# Patient Record
Sex: Female | Born: 1935 | Race: White | Hispanic: No | Smoking: Never smoker
Health system: Southern US, Community
[De-identification: ages and names within clinical notes are randomized; demographics above are authoritative.]

## PROBLEM LIST (undated history)

## (undated) DIAGNOSIS — I2510 Atherosclerotic heart disease of native coronary artery without angina pectoris: Secondary | ICD-10-CM

## (undated) DIAGNOSIS — I10 Essential (primary) hypertension: Secondary | ICD-10-CM

## (undated) DIAGNOSIS — I4891 Unspecified atrial fibrillation: Secondary | ICD-10-CM

## (undated) HISTORY — PX: TONSILLECTOMY: SUR1361

## (undated) HISTORY — PX: ABDOMINAL HYSTERECTOMY: SHX81

## (undated) HISTORY — PX: ROTATOR CUFF REPAIR: SHX139

## (undated) HISTORY — PX: CATARACT EXTRACTION, BILATERAL: SHX1313

## (undated) HISTORY — PX: CHOLECYSTECTOMY: SHX55

## (undated) HISTORY — PX: OOPHORECTOMY: SHX86

---

## 1998-02-25 ENCOUNTER — Other Ambulatory Visit: Admission: RE | Admit: 1998-02-25 | Discharge: 1998-02-25 | Payer: Self-pay | Admitting: Obstetrics and Gynecology

## 1999-06-22 ENCOUNTER — Ambulatory Visit (HOSPITAL_COMMUNITY): Admission: RE | Admit: 1999-06-22 | Discharge: 1999-06-22 | Payer: Self-pay | Admitting: *Deleted

## 1999-06-22 ENCOUNTER — Encounter: Payer: Self-pay | Admitting: *Deleted

## 2000-05-04 ENCOUNTER — Encounter: Payer: Self-pay | Admitting: *Deleted

## 2000-05-04 ENCOUNTER — Encounter: Admission: RE | Admit: 2000-05-04 | Discharge: 2000-05-04 | Payer: Self-pay | Admitting: *Deleted

## 2000-10-05 ENCOUNTER — Encounter: Admission: RE | Admit: 2000-10-05 | Discharge: 2000-10-05 | Payer: Self-pay | Admitting: Internal Medicine

## 2000-10-05 ENCOUNTER — Encounter: Payer: Self-pay | Admitting: Internal Medicine

## 2001-05-29 ENCOUNTER — Encounter: Payer: Self-pay | Admitting: Internal Medicine

## 2001-05-29 ENCOUNTER — Encounter: Admission: RE | Admit: 2001-05-29 | Discharge: 2001-05-29 | Payer: Self-pay | Admitting: Internal Medicine

## 2002-06-03 ENCOUNTER — Encounter: Payer: Self-pay | Admitting: Internal Medicine

## 2002-06-03 ENCOUNTER — Encounter: Admission: RE | Admit: 2002-06-03 | Discharge: 2002-06-03 | Payer: Self-pay | Admitting: Internal Medicine

## 2003-06-05 ENCOUNTER — Encounter: Payer: Self-pay | Admitting: Internal Medicine

## 2003-06-05 ENCOUNTER — Encounter: Admission: RE | Admit: 2003-06-05 | Discharge: 2003-06-05 | Payer: Self-pay | Admitting: Internal Medicine

## 2003-12-22 ENCOUNTER — Emergency Department (HOSPITAL_COMMUNITY): Admission: EM | Admit: 2003-12-22 | Discharge: 2003-12-23 | Payer: Self-pay | Admitting: Emergency Medicine

## 2004-01-17 ENCOUNTER — Ambulatory Visit (HOSPITAL_COMMUNITY): Admission: RE | Admit: 2004-01-17 | Discharge: 2004-01-17 | Payer: Self-pay | Admitting: Orthopedic Surgery

## 2004-02-12 ENCOUNTER — Encounter: Admission: RE | Admit: 2004-02-12 | Discharge: 2004-02-12 | Payer: Self-pay | Admitting: Orthopedic Surgery

## 2004-02-15 ENCOUNTER — Ambulatory Visit (HOSPITAL_COMMUNITY): Admission: RE | Admit: 2004-02-15 | Discharge: 2004-02-15 | Payer: Self-pay | Admitting: Orthopedic Surgery

## 2004-02-15 ENCOUNTER — Ambulatory Visit (HOSPITAL_BASED_OUTPATIENT_CLINIC_OR_DEPARTMENT_OTHER): Admission: RE | Admit: 2004-02-15 | Discharge: 2004-02-15 | Payer: Self-pay | Admitting: Orthopedic Surgery

## 2004-02-29 ENCOUNTER — Encounter: Admission: RE | Admit: 2004-02-29 | Discharge: 2004-05-29 | Payer: Self-pay | Admitting: Orthopedic Surgery

## 2004-04-18 ENCOUNTER — Encounter: Admission: RE | Admit: 2004-04-18 | Discharge: 2004-07-17 | Payer: Self-pay | Admitting: Orthopedic Surgery

## 2004-06-10 ENCOUNTER — Ambulatory Visit (HOSPITAL_COMMUNITY): Admission: RE | Admit: 2004-06-10 | Discharge: 2004-06-10 | Payer: Self-pay | Admitting: Internal Medicine

## 2005-06-13 ENCOUNTER — Ambulatory Visit (HOSPITAL_COMMUNITY): Admission: RE | Admit: 2005-06-13 | Discharge: 2005-06-13 | Payer: Self-pay | Admitting: Internal Medicine

## 2005-08-28 ENCOUNTER — Encounter: Admission: RE | Admit: 2005-08-28 | Discharge: 2005-08-28 | Payer: Self-pay | Admitting: Surgery

## 2005-08-31 ENCOUNTER — Encounter (INDEPENDENT_AMBULATORY_CARE_PROVIDER_SITE_OTHER): Payer: Self-pay | Admitting: Specialist

## 2005-08-31 ENCOUNTER — Ambulatory Visit (HOSPITAL_BASED_OUTPATIENT_CLINIC_OR_DEPARTMENT_OTHER): Admission: RE | Admit: 2005-08-31 | Discharge: 2005-08-31 | Payer: Self-pay | Admitting: Surgery

## 2005-08-31 ENCOUNTER — Ambulatory Visit (HOSPITAL_COMMUNITY): Admission: RE | Admit: 2005-08-31 | Discharge: 2005-08-31 | Payer: Self-pay | Admitting: Surgery

## 2006-01-10 ENCOUNTER — Inpatient Hospital Stay (HOSPITAL_COMMUNITY): Admission: EM | Admit: 2006-01-10 | Discharge: 2006-01-12 | Payer: Self-pay | Admitting: Emergency Medicine

## 2006-01-11 ENCOUNTER — Encounter (INDEPENDENT_AMBULATORY_CARE_PROVIDER_SITE_OTHER): Payer: Self-pay | Admitting: *Deleted

## 2006-06-27 ENCOUNTER — Ambulatory Visit (HOSPITAL_COMMUNITY): Admission: RE | Admit: 2006-06-27 | Discharge: 2006-06-27 | Payer: Self-pay | Admitting: Internal Medicine

## 2006-10-09 ENCOUNTER — Emergency Department (HOSPITAL_COMMUNITY): Admission: EM | Admit: 2006-10-09 | Discharge: 2006-10-10 | Payer: Self-pay | Admitting: Emergency Medicine

## 2007-07-02 ENCOUNTER — Ambulatory Visit (HOSPITAL_COMMUNITY): Admission: RE | Admit: 2007-07-02 | Discharge: 2007-07-02 | Payer: Self-pay | Admitting: Internal Medicine

## 2008-04-14 ENCOUNTER — Encounter: Admission: RE | Admit: 2008-04-14 | Discharge: 2008-04-14 | Payer: Self-pay | Admitting: Internal Medicine

## 2008-07-14 ENCOUNTER — Ambulatory Visit (HOSPITAL_COMMUNITY): Admission: RE | Admit: 2008-07-14 | Discharge: 2008-07-14 | Payer: Self-pay | Admitting: Internal Medicine

## 2008-08-26 ENCOUNTER — Ambulatory Visit (HOSPITAL_COMMUNITY): Admission: RE | Admit: 2008-08-26 | Discharge: 2008-08-26 | Payer: Self-pay | Admitting: Cardiology

## 2009-05-22 ENCOUNTER — Emergency Department (HOSPITAL_COMMUNITY): Admission: EM | Admit: 2009-05-22 | Discharge: 2009-05-22 | Payer: Self-pay | Admitting: Emergency Medicine

## 2010-03-30 ENCOUNTER — Emergency Department (HOSPITAL_BASED_OUTPATIENT_CLINIC_OR_DEPARTMENT_OTHER): Admission: EM | Admit: 2010-03-30 | Discharge: 2010-03-30 | Payer: Self-pay | Admitting: Emergency Medicine

## 2010-03-30 ENCOUNTER — Ambulatory Visit: Payer: Self-pay | Admitting: Diagnostic Radiology

## 2011-03-24 NOTE — Discharge Summary (Signed)
Maria [name], Maria [name]              ACCOUNT NO.:  [id number]   MEDICAL RECORD NO.:  [id number]          PATIENT TYPE:  INP   LOCATION:  2014                         FACILITY:  [facility]   PHYSICIAN:  Lyn Records, M.D.   DATE OF BIRTH:  1924/12/24   DATE OF ADMISSION:  01/10/2006  DATE OF DISCHARGE:  01/12/2006                                 DISCHARGE SUMMARY   ADMISSION DIAGNOSIS:  1.  Syncope secondary to atrial fibrillation with rapid ventricular response      with spontaneous conversion to normal sinus rhythm.   DISCHARGE DIAGNOSIS:  1.  Syncope secondary to atrial fibrillation with rapid ventricular response      with spontaneous conversion to normal sinus rhythm.  No recurrence of      syncope.  Currently maintaining normal sinus rhythm without recurrence      of atrial fibrillation.  2.  Atrial fibrillation with rapid ventricular response with spontaneous      conversion to normal sinus rhythm.  3.  Hypokalemia, repleted.  4.  Leukocytosis possibly secondary to chronic steroid use.  5.  Temporal arteritis.  Treated with chronic steroid use.  6.  Hypertension, controlled.  7.  Hyperthyroidism status post radiation treatment, no hypothyroidism.  8.  Herpes zoster secondary to Fosamax use.  9.  Glaucoma.  10. Gastroesophageal reflux disease.   PROCEDURES:  1.  2-D echocardiogram on January 11, 2006 revealed the following:      1.  Image quality was adequate.  Suboptimal parasternal windows.      2.  Left ventricular size was at the upper limits of normal.  Overall          left ventricular systolic function was normal.  Left ventricular          ejection fraction was estimated, range being 55-60%.  There was no          diagnostic evidence of left ventricular regional wall motion          abnormalities.  There was mild focal basal septal hypertrophy.      3.  Aortic valve thickness was mildly increased.  There was mild aortic          valvular regurgitation.      4.  There was  mild mitral valvular regurgitation.  2.  CT of the head without contrast.      1.  No acute intracranial abnormalities.      2.  Age-related atrophy and mild changes of small vessel disease of the          white matter diffusely.   HOSPITAL COURSE:  Maria [name] is a 77 year old female with a history of  hypertension, hypothyroidism, glaucoma, temporal arteritis, and at the time  of admission, a current diagnosis of herpes zoster.  She presented to the  [facility] Emergency Department after feeling weak and fatigued on the  morning of January 10, 2006.  She became diaphoretic and experienced a syncopal  episode after standing.  She did not recall any symptoms prior to the  syncopal episode and denies any loss of bowel  or bladder function during the  syncopal episode.  She regained consciousness at home.  She was brought to  the [facility] Emergency Department via EMS where an EKG revealed sinus  tachycardia with an arrhythmia.  Upon arrival she was currently in normal  sinus rhythm.  She denied having any of her cardiac medications today.  She  also denied taking her potassium for the past three months because the pills  were difficult to swallow.  The point of care enzyme x1 was negative.  Serial enzymes were obtained with a peak troponin I of 0.51.  The patient  was discovered to be hypokalemic upon this admission and her potassium was  repleted.  Her triamterene/HCTZ was held in the process of completing her  potassium.  She also complained of dizziness so orthostatic blood pressures  were obtained not only in the ED, but also daily.  She was not found to be  orthostatic.  She was started on Cardizem 60 mg every six hours with blood  pressure and heart rate parameters and the following day the Cardizem was  discontinued and she was started on Diltiazem ER 180 mg daily.  TSH,  magnesium, and UA with C&S were all within normal limits.  During the  syncopal episode the patient hit her head  and had a visible nodule.  She  denied headaches; however, a CT without contrast was ordered of her head and  yielded negative results.  A 2-D echocardiogram revealed normal systolic  function with EF in the range of 55-60%.  There were no wall motion  abnormalities; however, there was some mild aortic and mitral valve  regurgitation.  Also on 2-D echocardiogram mild focal basal septal  hypertrophy was seen.  Chest x-ray upon admission was normal and the patient  remained afebrile.  Maria [name] is on chronic steroid use for her temporal  arteritis which is believed to be the cause of her leukocytosis which showed  improvement during the admission.  She was started on a low-dose beta  blocker during this admission as well as scheduled for an adenosine  Cardiolite as an outpatient.  The patient denied any further palpitations,  dizziness, weakness, fatigue.  She also denied chest pain.  On telemetry she  remained in normal sinus rhythm without any recurrence of atrial  fibrillation.  Subsequent EKGs revealed normal sinus rhythm with nonspecific  ST and T-wave abnormality.  There were no recurrences of syncopal episodes.  Prior to discharge hypokalemia and leukocytosis were resolved.  The  Diltiazem ER 180 mg was discontinued.  The patient was discharged to home in  stable condition.   LABORATORY DATA:  White blood count 7.1, hemoglobin 12.1, hematocrit 34.5,  platelets 166.  Sodium 141, potassium 4.2, chloride 113, CO2 25, glucose 87,  BUN 16, creatinine 1.1.  Serial cardiac enzymes CK total 40, 73, and 77,  respectively.  CK-MB 2.8, 4.6, and 3.7, respectively.  Troponin I 0.16,  0.51, and 0.44, respectively.  BNP 1.36.  TSH 1.806.  PT 12.9, INR 1, PTT  25.   Chest x-ray single view January 10, 2006:  Scarring at the left lung base - no  active disease.   EKG January 11, 2006:  Normal sinus rhythm with a ventricular rate of 94 bpm  with nonspecific ST and T-wave abnormalities.  CONDITION ON  DISCHARGE:  On January 12, 2006 the patient was discharged to home  in stable condition without any complaints of palpitations, weakness,  fatigue, dizziness, syncope, headaches,  chest pain, or other complaints.   DISCHARGE MEDICATIONS:  1.  Synthroid 100 mcg daily.  2.  Prednisone 17 mg daily.  3.  Nexium 40 mg daily with the next dose to begin on January 13, 2006.  4.  Triamterene/HCTZ 37.5/25 mg daily with the next dose to restart after      your next BMET.  5.  Calcium plus vitamin D 600 mg twice daily.  6.  Multivitamin daily.  7.  Potassium elixir 20 mEq per teaspoon one teaspoon daily.  This is a new      prescription with a prescription given with refills.  8.  Lopressor 25 mg twice daily.  9.  Coated aspirin 81 mg daily.   DISCHARGE INSTRUCTIONS:  1.  The patient has been instructed to follow a heart-healthy, low salt      diet.  The patient has also been instructed to call Dr. Katrinka Blazing with      recurrent episodes of palpitations, dizziness, weakness, or fatigue.  2.  The patient has also been instructed to adhere to the following      instructions before her scheduled outpatient adenosine Cardiolite.      1.  Nothing to eat or drink after midnight the night before the          adenosine Cardiolite except medications.      2.  No caffeine 24 hours prior to test.      3.  No smoking the day of test.   FOLLOW UP ARRANGEMENTS:  1.  The patient has been scheduled for an adenosine Cardiolite stress test      on an outpatient basis at [facility] on January 16, 2006 at 12:00      p.m.  She is to call [phone] if she is unable to keep the scheduled      appointment.  2.  The patient has been scheduled for follow-up appointment with Dr. Katrinka Blazing      at the [facility] office on January 25, 2006 2:30 p.m.  The patient      is scheduled to call [phone] if she is unable to keep the scheduled      appointment.      Tylene Fantasia, [place]      Lyn Records, M.D.   Electronically Signed    RDM/MEDQ  D:  03/12/2006  T:  03/13/2006  Job:  [id number]   cc:   Thora Lance, M.D.  Fax: [phone]

## 2011-03-24 NOTE — Op Note (Signed)
NAME:  Maria [name], Maria [name]                        ACCOUNT NO.:  [id number]   MEDICAL RECORD NO.:  [id number]                   PATIENT TYPE:  AMB   LOCATION:  DSC                                  FACILITY:  [facility]   PHYSICIAN:  Robert A. Thurston Hole, M.D.              DATE OF BIRTH:  05/11/1925   DATE OF PROCEDURE:  02/15/2004  DATE OF DISCHARGE:                                 OPERATIVE REPORT   PREOPERATIVE DIAGNOSES:  1. Left shoulder rotator cuff tear.  2. Left shoulder partial glenoid labrum tear.  3. Left shoulder adhesive capsulitis.  4. Left shoulder impingement.   POSTOPERATIVE DIAGNOSES:  1. Left shoulder rotator cuff tear.  2. Left shoulder partial glenoid labrum tear.  3. Left shoulder adhesive capsulitis.  4. Left shoulder impingement.   PROCEDURES:  1. Left shoulder examination under anesthesia, followed by manipulation.  2. Left shoulder arthroscopically-assisted rotator cuff repair using Arthrex     suture anchor x1.  3. Left shoulder subacromial decompression.  4. Left shoulder lysis of adhesions.   SURGEON:  Elana Alm. Thurston Hole, M.D.   ASSISTANT:  Julien Girt, P.A.   ANESTHESIA:  General.   OPERATIVE TIME:  One hour.   COMPLICATIONS:  None.   INDICATION FOR PROCEDURE:  Maria [name] is a 77 year old woman who injured her  left shoulder three months ago in a fall.  Significant pain with exam and  MRI documenting a rotator cuff tear and now secondary adhesive capsulitis  with impingement, who has failed conservative care and is now to undergo  arthroscopy and repair.   DESCRIPTION:  Maria [name] was brought to the operating room on February 15, 2004, after an interscalene block had been placed in the holding room by  anesthesia.  She was placed on the operating table in supine position.  After being placed under general anesthesia, she was placed in a beach chair  position.  Her shoulder was examined.  Initial range of motion showed  forward flexion to 90,  abduction to 90, internal and external rotation of 50  degrees.  A gentle manipulation was carried out, breaking up adhesions and  improving forward flexion to 175, abduction to 175, internal and external  rotation of 85 degrees.  The shoulder remained stable to ligamentous exam.  She received Ancef 1 g IV preoperatively for prophylaxis.  Her left shoulder  and arm were then prepped using sterile Duraprep and draped using sterile  technique.  Originally through a posterior arthroscopic portal, the  arthroscope with a pump attached was placed into an anterior portal and  arthroscopic probe was placed.  On initial inspection the articular  cartilage in the glenohumeral joint was intact.  There was significant  hemorrhagic synovitis secondary to her manipulation and adhesive capsulitis,  and this was debrided.  She had tearing of the anterior labrum 25-30%, which  was debrided.  Superior labrum, biceps tendon anchor was intact.  The  biceps  tendon was intact.  The inferior labrum and anterior inferior glenohumeral  ligament complex was intact.  Posterior labrum showed moderate fraying, and  this was debrided.  The rotator cuff showed a complete tear of the  supraspinatus, which was partially debrided.  The rest of the rotator cuff  was intact.  The inferior capsular recess was free of pathology.  Subacromial space was entered and a lateral arthroscopic portal was made.  Moderately thickened bursitis was resected.  Impingement was noted and  subacromial decompression was carried out as well as a CA ligament release.  The [facility] joint was not disturbed.  The rotator cuff showed a tear clearly on  the bursal side, and this was further debrided arthroscopically and through  an accessory portal.  An Arthrex suture anchor was placed and then each of  the sutures were passed through the rotator cuff tear and tied down  arthroscopically.  After this was done, the shoulder could be brought  through a full  range of motion with no impingement on the repair.  At this  point it was felt that all pathology had been satisfactorily addressed.  The  instruments were removed.  Portals closed with 3-0 nylon suture, sterile  dressings and a sling applied, and the patient awakened and taken to the  recovery room in stable condition.   FOLLOW-UP CARE:  Maria [name] will be followed as an outpatient on Vicodin and  ibuprofen, with early physical therapy.  See her back in the office in a  week for sutures out and follow-up.                                               Robert A. Thurston Hole, M.D.    RAW/MEDQ  D:  02/15/2004  T:  02/15/2004  Job:  [id number]

## 2011-03-24 NOTE — Op Note (Signed)
[name], [name]              ACCOUNT NO.:  [id number]   MEDICAL RECORD NO.:  [id number]          PATIENT TYPE:  AMB   LOCATION:  DSC                          FACILITY:  [facility]   PHYSICIAN:  Thomas A. Cornett, M.D.DATE OF BIRTH:  04-29-1925   DATE OF PROCEDURE:  08/31/2005  DATE OF DISCHARGE:                                 OPERATIVE REPORT   PREOPERATIVE DIAGNOSIS:  Headaches, questionable temporal arteritis.   POSTOPERATIVE DIAGNOSIS:  Headaches, questionable temporal arteritis.   PROCEDURE:  Right temporal artery biopsy.   SURGEON:  Maisie Fus A. Cornett, M.D.   ASSISTANT:  Leonie Man, M.D.   ANESTHESIA:  MAC using approximately 30 mL of 0.25% Sensorcaine local with  epinephrine.   ESTIMATED BLOOD LOSS:  10 mL.   FINDINGS:  Severe inflammatory change to the temporal artery with a  significant amount of calcification.   INDICATIONS FOR PROCEDURE:  The patient is a 77 year old female who had been  having headaches.  She was referred for a temporal artery biopsy to exclude  temporal arteritis.  She had elevated CRP as well as other inflammatory  indicators.  After discussion of the procedure, she agreed to proceed.   DESCRIPTION OF PROCEDURE:  The patient was brought to the operating suite  and placed supine.  The right temple region was prepped and draped in a  sterile fashion.  She had a very weak pulsation over this area.  Next, a  skin incision was made over the right temporal region and dissection was  carried down to the subcutaneous fat.  The initial fascial layer was opened  and I used and Doppler to try to locate the vessel.  This was very difficult  since there did not appear to be a significant superficial component to the  temporal artery.  We dissected deeper down toward the temporalis muscle.  Again, we were having a very difficult time finding the vessel, since there  was not a very strong pulsation to it and the Doppler signal was quite  faint.  After  an hour and a half of searching for the vessel carefully, we  dissected down into the temporalis muscle itself.  Once we were able to get  down through this, we encountered the vessel deep in the temporalis muscle.  It was severely inflamed and appeared atherosclerotic as well.  We were able  to isolate a small segment of it in the muscle area itself and try to  dissect this out.  This again was extremely inflamed and difficult to find,  but at this point, it appeared arterial was significant calcification.  Of  note, this appeared severely inflamed.  We ligated a small segment of the  vessel using 3-0 Vicryl ties.  We then took the vessel within the muscle  itself and sent a piece of skeletal muscle with it as well.  Unfortunately,  the specimen was not big enough for a frozen section. but it did appear to  be the temporal artery, once we dissected out the vessel.  At  this point in time the wound was closed with a combination of  3-0 Vicryl and  4-0 Monocryl subcuticular stitches.  All sponge, needle and instrument  counts were counted and found to be correct at this portion of the case.  The patient was taken to recovery in satisfactory condition.      Thomas A. Cornett, M.D.  Electronically Signed     TAC/MEDQ  D:  08/31/2005  T:  09/01/2005  Job:  [id number]   cc:   Thora Lance, M.D.  Fax: [phone]   [facility]

## 2011-03-24 NOTE — H&P (Signed)
NAMEANDERSYN, FRAGOSO NO.:  [id number]   MEDICAL RECORD NO.:  [id number]          PATIENT TYPE:  INP   LOCATION:  1832                         FACILITY:  [facility]   PHYSICIAN:  Lyn Records, M.D.   DATE OF BIRTH:  03-Aug-1925   DATE OF ADMISSION:  01/10/2006  DATE OF DISCHARGE:                                HISTORY & PHYSICAL   PHYSICIANS:  Cardiologist: Lyn Records, M.D.  Primary care physician: Thora Lance, M.D.   HISTORY OF PRESENT ILLNESS:  Mrs. Dickman is a 77 year old Caucasian female,  with a history of hypertension, who was in her usual state of health until  this morning when awakened reporting not having slept well last night.  She  complained of feeling fatigued and weak.  She is on chronic steroid for  temporal arteritis and reported taking her morning dosage of prednisone;  however, denies taking any of her other medications because she just did not  feel well.  She also denies having taken potassium 20 mEq for the past 3  months because the tablets are too large for her to swallow.  She reported  going back to bed to rest and became severely diaphoretic.  She called a  friend on the phone to share that she was not feeling well, and the friend  told her that she would come over.  Upon getting up to open the glass doors  so that the friend could come in, she experienced syncope after standing.  She denies any symptoms prior to the syncopal episode.  She denies loss of  bowel or bladder function during the syncopal episode.  She regained  consciousness at home.  Her friend called EMS, and on the way to the [facility] Emergency Department, EMS performed a 12-lead EKG, and the patient was  shown to be in atrial fibrillation which is considered new in onset.  The  patient spontaneously converted back to normal sinus rhythm prior to arrival  to the [facility] Emergency Department.  Upon arrival to the emergency  department, another 12-lead EKG was  performed and revealed sinus tachycardia  with PACs with a ventricular rate of 110 beats per minute.  Point-of-care  enzymes are negative x1. The patient denies chest pain, shortness of breath,  nausea, vomiting, and dizziness.   PAST MEDICAL HISTORY:  1.  History of hyperthyroidism status post radiation treatment, now      hypothyroidism.  2.  Glaucoma.  3.  Temporal arteritis.  4.  History of anxiety.  5.  GERD.  6.  Current diagnosis of herpes zoster secondary to FOSAMAX allergy.   ALLERGIES:  FOSAMAX, herpes zoster.   HOME MEDICATIONS:  1.  Synthroid mcg daily.  2.  Triamterene/hydrochlorothiazide 37.5/25 mg daily.  3.  Diltiazem ER 180 mg daily.  4.  Potassium CL 20 mEq daily.  The patient denies taking this medication      over the past 3 months.  5.  Calcium plus D 600 mg twice daily.  6.  Prednisone 17 mg daily.  7.  Multivitamin daily.  8.  Nexium  40 mg daily.   PAST SURGICAL HISTORY:  1.  Status post thyroid radiation secondary to hyperthyroidism.  2.  Laser eye surgery bilaterally.   FAMILY HISTORY:  1.  Mom with questionable coronary artery disease.  2.  Sister with Alzheimer's.   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  She is a widow and  lives alone in [place].  She has no children.  She denies tobacco,  alcohol, or illicit drug use.   REVIEW OF SYSTEMS:  All other systems reviewed are otherwise negative other  than as stated in the HPI.   PHYSICAL EXAMINATION:  GENERAL: A 77 year old female, pleasant and  cooperative, no acute distress.  VITAL SIGNS:  Temperature 98.9, pulse 103, respirations 20, blood pressure  121/56, O2 saturation 98% on 2 liters.  HEENT: Nodule on the back of skull.  NECK:  No JVD or carotid bruits bilaterally.  LUNGS:  Breath sounds aer equal and clear to auscultation bilaterally  without wheezes, rhonchi, crackles, or rales.  HEART: Regular rate and rhythm.  S1 and S2 normal. No murmurs, gallops,  clicks, or  rubs.  ABDOMEN: Soft, mild tenderness without rebound or guarding.  Active bowel  sounds without aortic or iliac bruits.  No hepatomegaly or masses.  EXTREMITIES:  No peripheral edema.  DT and PT pulses 2+/2 bilaterally.  SKIN:  Warm and dry with herpes zoster on abdomen.  NEUROLOGIC:  Alert and oriented x3.  No focal deficits.  PSYCHIATRIC: Normal mood and affect.   LABORATORY DATA:  Sodium 139, potassium 2.7, chloride 103, CO2 28.2, BUN 19,  creatinine 1.3, glucose 159.  White blood cell 15.1, hemoglobin 13.6,  hematocrit 39.1, platelets 177.  PT 12.9, INR 1, PTT 25.  TSH is pending.  UA is pending.   Chest x-ray: No active disease.   CT of the head: No acute intracranial abnormalities, age-related atrophy,  and mild changes of small vessel disease of the white matter diffusely.   EKG: Sinus tachycardia with PACs with a ventricular rate of 110 beats per  minute.   ASSESSMENT:  1.  Atrial fibrillation with rapid ventricular response with spontaneous      conversion to normal sinus rhythm.  2.  Syncope secondary to #1.  3.  Hypokalemia, possibly secondary to medical noncompliance.  4.  Leukocytosis, possibly secondary to chronic steroid use.  5.  Temporal arteritis.  6.  Hypertension, controlled.  7.  Hyperthyroidism status post radiation treatment, no hypothyroidism.  8.  Herpes zoster.  9.  Glaucoma.  10. Gastroesophageal reflux disease.   PLAN:  1.  Admit to cardiac telemetry unit.  2.  Replete potassium.  In the emergency department, potassium was 40 mEq;      p.o. was given as well as potassium IV 1 given.  3.  Rule out myocardial infarction.  Check serial cardiac enzymes including      CK total, CK-MB, and troponin I.  4.  Check TSH, UA, and BNP.  5.  EKG and BMET in morning.  6.  Check orthostatic blood pressure and heart rate now and every morning.  7.  Start Cardizem 60 mg p.o. q. 6 h.  Continue if systolic blood pressure     is greater than 100 or if heart rate  is less than or equal to 60, then      hold.  Discontinue in morning, then start diltiazem ER 180 mg daily.  8.  Obtain 2-D echocardiogram.  9.  Start triamterene/hydrochlorothiazide 37.5/25 mg (  hold until morning      potassium results are obtained).  10. See cardiac p.r.n. orders that have been initiated.  11. Admit to cardiac telemetry unit under Dr. Corky Sing service.  12. Check magnesium in morning.  13. Start Lovenox per pharmacy protocol.      Tylene Fantasia, [place]      Lyn Records, M.D.  Electronically Signed    RDM/MEDQ  D:  01/10/2006  T:  01/10/2006  Job:  [id number]   cc:   Thora Lance, M.D.  Fax: [phone]

## 2011-09-13 ENCOUNTER — Ambulatory Visit (HOSPITAL_COMMUNITY)
Admission: RE | Admit: 2011-09-13 | Discharge: 2011-09-13 | Disposition: A | Discharge status: Home / Self Care | Payer: Medicare Other | Source: Ambulatory Visit | Attending: Interventional Cardiology | Admitting: Interventional Cardiology

## 2011-09-13 DIAGNOSIS — I4891 Unspecified atrial fibrillation: Secondary | ICD-10-CM | POA: Insufficient documentation

## 2011-09-13 DIAGNOSIS — Z79899 Other long term (current) drug therapy: Secondary | ICD-10-CM | POA: Insufficient documentation

## 2012-03-23 ENCOUNTER — Encounter (HOSPITAL_BASED_OUTPATIENT_CLINIC_OR_DEPARTMENT_OTHER): Payer: Self-pay | Admitting: Emergency Medicine

## 2012-03-23 ENCOUNTER — Emergency Department (HOSPITAL_BASED_OUTPATIENT_CLINIC_OR_DEPARTMENT_OTHER): Payer: Medicare Other

## 2012-03-23 ENCOUNTER — Emergency Department (HOSPITAL_BASED_OUTPATIENT_CLINIC_OR_DEPARTMENT_OTHER)
Admission: EM | Admit: 2012-03-23 | Discharge: 2012-03-23 | Disposition: A | Discharge status: Home / Self Care | Payer: Medicare Other | Attending: Emergency Medicine | Admitting: Emergency Medicine

## 2012-03-23 DIAGNOSIS — M549 Dorsalgia, unspecified: Secondary | ICD-10-CM | POA: Insufficient documentation

## 2012-03-23 DIAGNOSIS — Z79899 Other long term (current) drug therapy: Secondary | ICD-10-CM | POA: Insufficient documentation

## 2012-03-23 DIAGNOSIS — I2510 Atherosclerotic heart disease of native coronary artery without angina pectoris: Secondary | ICD-10-CM | POA: Insufficient documentation

## 2012-03-23 DIAGNOSIS — I10 Essential (primary) hypertension: Secondary | ICD-10-CM | POA: Insufficient documentation

## 2012-03-23 DIAGNOSIS — X58XXXA Exposure to other specified factors, initial encounter: Secondary | ICD-10-CM | POA: Insufficient documentation

## 2012-03-23 DIAGNOSIS — IMO0002 Reserved for concepts with insufficient information to code with codable children: Secondary | ICD-10-CM | POA: Insufficient documentation

## 2012-03-23 DIAGNOSIS — M1711 Unilateral primary osteoarthritis, right knee: Secondary | ICD-10-CM

## 2012-03-23 DIAGNOSIS — Y92410 Unspecified street and highway as the place of occurrence of the external cause: Secondary | ICD-10-CM | POA: Insufficient documentation

## 2012-03-23 DIAGNOSIS — M1710 Unilateral primary osteoarthritis, unspecified knee: Secondary | ICD-10-CM | POA: Insufficient documentation

## 2012-03-23 DIAGNOSIS — M79609 Pain in unspecified limb: Secondary | ICD-10-CM | POA: Insufficient documentation

## 2012-03-23 DIAGNOSIS — M7989 Other specified soft tissue disorders: Secondary | ICD-10-CM | POA: Insufficient documentation

## 2012-03-23 DIAGNOSIS — Y998 Other external cause status: Secondary | ICD-10-CM | POA: Insufficient documentation

## 2012-03-23 DIAGNOSIS — I4891 Unspecified atrial fibrillation: Secondary | ICD-10-CM | POA: Insufficient documentation

## 2012-03-23 DIAGNOSIS — S86919A Strain of unspecified muscle(s) and tendon(s) at lower leg level, unspecified leg, initial encounter: Secondary | ICD-10-CM

## 2012-03-23 HISTORY — DX: Atherosclerotic heart disease of native coronary artery without angina pectoris: I25.10

## 2012-03-23 HISTORY — DX: Unspecified atrial fibrillation: I48.91

## 2012-03-23 HISTORY — DX: Essential (primary) hypertension: I10

## 2012-03-23 MED ORDER — ACETAMINOPHEN-CODEINE #3 300-30 MG PO TABS: 1.0000 | ORAL_TABLET | Freq: Four times a day (QID) | ORAL | Status: AC | PRN

## 2012-03-23 MED ORDER — ACETAMINOPHEN-CODEINE #3 300-30 MG PO TABS
1.0000 | ORAL_TABLET | Freq: Once | ORAL | Status: AC
  Administered 2012-03-23: 1 via ORAL
  Filled 2012-03-23: qty 1

## 2012-03-23 NOTE — ED Provider Notes (Signed)
History     CSN: [id number]  Arrival date & time 03/23/12  1039   First MD Initiated Contact with Patient 03/23/12 1155      Chief Complaint  Patient presents with  . Leg Injury  . Back Pain    (Consider location/radiation/quality/duration/timing/severity/associated sxs/prior treatment) HPI Comments: 2 days ago, pt was on a bus with family and church, had traveled to [place] and after second stop, she was trying to walk off of the bus, at bottom, right knee gave way.  She has had gradually worsening pain diffusely to right leg and also to lower back on right and midline.  Did not fall.  Pain worse to weight bear.  No urinary difficulty, denies numbness or weakness.  Both legs were mildly more swollen after trip.  No CP, SOB.  No abrasion, laceration.  Pt took aleve at home with no relief.    Patient is a 78 y.o. female presenting with back pain. The history is provided by the patient and a relative.  Back Pain  Pertinent negatives include no chest pain, no numbness and no weakness.    Past Medical History  Diagnosis Date  . Hypertension   . Coronary artery disease   . Atrial fibrillation     Past Surgical History  Procedure Date  . Abdominal hysterectomy   . Cholecystectomy   . Rotator cuff repair   . Tonsillectomy   . Oophorectomy   . Cataract extraction, bilateral     History reviewed. No pertinent family history.  History  Substance Use Topics  . Smoking status: Not on file  . Smokeless tobacco: Not on file  . Alcohol Use:     OB History    Grav Para Term Preterm Abortions TAB SAB Ect Mult Living                  Review of Systems  Constitutional: Negative.   Respiratory: Negative for cough and shortness of breath.   Cardiovascular: Negative for chest pain.  Genitourinary: Negative for urgency, flank pain, decreased urine volume and difficulty urinating.  Musculoskeletal: Positive for back pain and arthralgias. Negative for myalgias and joint swelling.    Skin: Negative for color change, rash and wound.  Neurological: Negative for weakness and numbness.    Allergies  Fosamax and Coumadin  Home Medications   Current Outpatient Rx  Name Route Sig Dispense Refill  . AMIODARONE HCL 200 MG PO TABS Oral Take 200 mg by mouth daily.    [name] VITAMIN D3 1000 UNITS PO CAPS Oral Take 1 capsule by mouth daily.    [name] DABIGATRAN ETEXILATE MESYLATE 150 MG PO CAPS Oral Take 150 mg by mouth every 12 (twelve) hours.    [name] LOSARTAN POTASSIUM 25 MG PO TABS Oral Take 25 mg by mouth daily.    [name] METOPROLOL SUCCINATE ER 50 MG PO TB24 Oral Take 50 mg by mouth daily. Take with or immediately following a meal.    . OMEPRAZOLE 20 MG PO CPDR Oral Take 20 mg by mouth daily.    [name] PREDNISONE 5 MG PO TABS Oral Take 5 mg by mouth daily.    [name] VITAMINS/MINERALS PO TABS Oral Take 1 tablet by mouth daily. Occuvite    . ACETAMINOPHEN-CODEINE #3 300-30 MG PO TABS Oral Take 1-2 tablets by mouth every 6 (six) hours as needed for pain. 20 tablet 0    BP 182/65  Pulse 63  Temp(Src) 98.6 F (37 C) (Oral)  Resp 18  SpO2  98%  Physical Exam  Nursing note and vitals reviewed. Constitutional: She appears well-developed and well-nourished.  HENT:  Head: Normocephalic and atraumatic.  Neck: Normal range of motion. Neck supple.  Cardiovascular: Normal rate.   Pulmonary/Chest: Effort normal.  Musculoskeletal:       Right hip: Normal.       Right knee: She exhibits normal range of motion, no swelling, no effusion, no ecchymosis, no deformity, no laceration, no erythema and no bony tenderness. tenderness found. Medial joint line tenderness noted.       Right ankle: Normal.       Neg Homan's sign, no calf tenderness  Neurological: She is alert.  Skin: Skin is warm and dry. No rash noted. No erythema.    ED Course  Procedures (including critical care time)  Labs Reviewed - No data to display US Venous Img Lower Unilateral Right  03/23/2012  *RADIOLOGY REPORT*  Clinical Data:  Right lower extremity pain and swelling.  RIGHT LOWER EXTREMITY VENOUS DUPLEX ULTRASOUND  Technique:  Gray-scale sonography with graded compression, as well as color Doppler and duplex ultrasound were performed to evaluate the deep venous system of the lower extremity from the level of the common femoral vein through the popliteal and proximal calf veins. Spectral Doppler was utilized to evaluate flow at rest and with distal augmentation maneuvers.  Comparison:  None.  Findings:  Normal compressibility of the common femoral, superficial femoral, and popliteal veins is demonstrated, as well as the visualized proximal calf veins.  No filling defects to suggest DVT on grayscale or color Doppler imaging.  Doppler waveforms show normal direction of venous flow, normal respiratory phasicity and response to augmentation.  IMPRESSION: No evidence of right lower extremity deep vein thrombosis.  Original Report Authenticated By: Rosendo Gros, M.D.   Dg Knee Complete 4 Views Right  03/23/2012  *RADIOLOGY REPORT*  Clinical Data: H the istory of knee pain.  RIGHT KNEE - COMPLETE 4+ VIEW  Comparison: Right knee radiographs 09/26/2010.  Findings: Multiple views of the right knee demonstrate no acute fracture, subluxation or dislocation.  There is mild joint space narrowing and subchondral sclerosis, most pronounced in the medial and patellofemoral compartments, consistent with mild osteoarthritis.  IMPRESSION: 1.  No acute radiographic abnormality of the right knee. 2.  Mild osteoarthritis most pronounced in the medial and patellofemoral compartments.  Original Report Authenticated By: Florencia Reasons, M.D.     1. Knee strain   2. Osteoarthritis of right knee       MDM  Given worsening pain, obtained U/S to r/o DVT, and plain films of knee on right shows arthritis.  No fracture.  I reviewed myself and also reviewed radiologist interpretations.  Pt's HTN is likely due to pain and chronic history, no end organ  damage.  Pt already has an appt with Dr. Thurston Hole for Monday, pt givne Codeine as prescription for additional pain relief at home.          Gavin Pound. Kentley Blyden, MD 03/23/12 1357

## 2012-03-23 NOTE — ED Notes (Signed)
Pt steps she was stepping down off a bus and she felt like her right leg gave out suddenly.  Pt was caught by family, no dizziness prior.  Pt states she is having pain to right leg, knee and middle of back which has progressed since Thursday when it occurred.  Pain worsens with movement and ambulation.  No chest pain or sob.

## 2012-03-23 NOTE — Discharge Instructions (Signed)
Arthritis, Nonspecific Arthritis is inflammation of a joint. This usually means pain, redness, warmth or swelling are present. One or more joints may be involved. There are a number of types of arthritis. Your caregiver may not be able to tell what type of arthritis you have right away. CAUSES  The most common cause of arthritis is the wear and tear on the joint (osteoarthritis). This causes damage to the cartilage, which can break down over time. The knees, hips, back and neck are most often affected by this type of arthritis. Other types of arthritis and common causes of joint pain include:  Sprains and other injuries near the joint. Sometimes minor sprains and injuries cause pain and swelling that develop hours later.   Rheumatoid arthritis. This affects hands, feet and knees. It usually affects both sides of your body at the same time. It is often associated with chronic ailments, fever, weight loss and general weakness.   Crystal arthritis. Gout and pseudo gout can cause occasional acute severe pain, redness and swelling in the foot, ankle, or knee.   Infectious arthritis. Bacteria can get into a joint through a break in overlying skin. This can cause infection of the joint. Bacteria and viruses can also spread through the blood and affect your joints.   Drug, infectious and allergy reactions. Sometimes joints can become mildly painful and slightly swollen with these types of illnesses.  SYMPTOMS   Pain is the main symptom.   Your joint or joints can also be red, swollen and warm or hot to the touch.   You may have a fever with certain types of arthritis, or even feel overall ill.   The joint with arthritis will hurt with movement. Stiffness is present with some types of arthritis.  DIAGNOSIS  Your caregiver will suspect arthritis based on your description of your symptoms and on your exam. Testing may be needed to find the type of arthritis:  Blood and sometimes urine tests.    X-ray tests and sometimes CT or MRI scans.   Removal of fluid from the joint (arthrocentesis) is done to check for bacteria, crystals or other causes. Your caregiver (or a specialist) will numb the area over the joint with a local anesthetic, and use a needle to remove joint fluid for examination. This procedure is only minimally uncomfortable.   Even with these tests, your caregiver may not be able to tell what kind of arthritis you have. Consultation with a specialist (rheumatologist) may be helpful.  TREATMENT  Your caregiver will discuss with you treatment specific to your type of arthritis. If the specific type cannot be determined, then the following general recommendations may apply. Treatment of severe joint pain includes:  Rest.   Elevation.   Anti-inflammatory medication (for example, ibuprofen) may be prescribed. Avoiding activities that cause increased pain.   Only take over-the-counter or prescription medicines for pain and discomfort as recommended by your caregiver.   Cold packs over an inflamed joint may be used for 10 to 15 minutes every hour. Hot packs sometimes feel better, but do not use overnight. Do not use hot packs if you are diabetic without your caregiver's permission.   A cortisone shot into arthritic joints may help reduce pain and swelling.   Any acute arthritis that gets worse over the next 1 to 2 days needs to be looked at to be sure there is no joint infection.  Long-term arthritis treatment involves modifying activities and lifestyle to reduce joint stress jarring. This can  include weight loss. Also, exercise is needed to nourish the joint cartilage and remove waste. This helps keep the muscles around the joint strong. HOME CARE INSTRUCTIONS   Do not take aspirin to relieve pain if gout is suspected. This elevates uric acid levels.   Only take over-the-counter or prescription medicines for pain, discomfort or fever as directed by your caregiver.    Rest the joint as much as possible.   If your joint is swollen, keep it elevated.   Use crutches if the painful joint is in your leg.   Drinking plenty of fluids may help for certain types of arthritis.   Follow your caregiver's dietary instructions.   Try low-impact exercise such as:   Swimming.   Water aerobics.   Biking.   Walking.   Morning stiffness is often relieved by a warm shower.   Put your joints through regular range-of-motion.  SEEK MEDICAL CARE IF:   You do not feel better in 24 hours or are getting worse.   You have side effects to medications, or are not getting better with treatment.  SEEK IMMEDIATE MEDICAL CARE IF:   You have a fever.   You develop severe joint pain, swelling or redness.   Many joints are involved and become painful and swollen.   There is severe back pain and/or leg weakness.   You have loss of bowel or bladder control.  Document Released: 11/30/2004 Document Revised: 10/12/2011 Document Reviewed: 12/16/2008 [facility] Patient Information 2012 [place], [place].    Knee Sprain You have a knee sprain. Sprains are painful injuries to the joints. A sprain is a partial or complete tearing of ligaments. Ligaments are tough, fibrous tissues that hold bones together at the joints. A strain (sprain) has occurred when a ligament is stretched or damaged. This injury may take several weeks to heal. This is often the same length of time as a bone fracture (break in bone) takes to heal. Even though a fracture (bone break) may not have occurred, the recovery times may be similar. HOME CARE INSTRUCTIONS   Rest the injured area for as long as directed by your caregiver. Then slowly start using the joint as directed by your caregiver and as the pain allows. Use crutches as directed. If the knee was splinted or casted, continue use and care as directed. If an ace bandage has been applied today, it should be removed and reapplied every 3 to 4 hours. It  should not be applied tightly, but firmly enough to keep swelling down. Watch toes and feet for swelling, bluish discoloration, coldness, numbness or excessive pain. If any of these symptoms occur, remove the ace bandage and reapply more loosely.If these symptoms persist, seek medical attention.   For the first 24 hours, lie down. Keep the injured extremity elevated on two pillows.   Apply ice to the injured area for 15 to 20 minutes every couple hours. Repeat this 3 to 4 times per day for the first 48 hours. Put the ice in a plastic bag and place a towel between the bag of ice and your skin.   Wear any splinting, casting, or elastic bandage applications as instructed.   Only take over-the-counter or prescription medicines for pain, discomfort, or fever as directed by your caregiver. Do not use aspirin immediately after the injury unless instructed by your caregiver. Aspirin can cause increased bleeding and bruising of the tissues.   If you were given crutches, continue to use them as instructed. Do not resume  weight bearing on the affected extremity until instructed.  Persistent pain and inability to use the injured area as directed for more than 2 to 3 days are warning signs. If this happens you should see a caregiver for a follow-up visit as soon as possible. Initially, a hairline fracture (this is the same as a broken bone) may not be evident on x-rays. Persistent pain and swelling indicate that further evaluation, non-weight bearing (use of crutches as instructed), and/or further x-rays are indicated. X-rays may sometimes not show a small fracture until a week or ten days later. Make a follow-up appointment with your own caregiver or one to whom we have referred you. A radiologist (specialist in reading x-rays) may re-read your X-rays. Make sure you know how you are to get your x-ray results. Do not assume everything is normal if you do not hear from [place]. SEEK MEDICAL CARE IF:   Bruising, swelling,  or pain increases.   You have cold or numb toes   You have continuing difficulty or pain with walking.  SEEK IMMEDIATE MEDICAL CARE IF:   Your toes are cold, numb or blue.   The pain is not responding to medications and continues to stay the same or get worse.  MAKE SURE YOU:   Understand these instructions.   Will watch your condition.   Will get help right away if you are not doing well or get worse.  Document Released: 10/23/2005 Document Revised: 10/12/2011 Document Reviewed: 10/07/2007 [facility] Patient Information 2012 [place], [place].    Narcotic and benzodiazepine use may cause drowsiness, slowed breathing or dependence.  Please use with caution and do not drive, operate machinery or watch young children alone while taking them.  Taking combinations of these medications or drinking alcohol will potentiate these effects.

## 2012-04-03 ENCOUNTER — Ambulatory Visit: Payer: Medicare Other | Attending: Physical Medicine and Rehabilitation | Admitting: Physical Therapy

## 2012-04-03 DIAGNOSIS — IMO0001 Reserved for inherently not codable concepts without codable children: Secondary | ICD-10-CM | POA: Insufficient documentation

## 2012-04-03 DIAGNOSIS — M25569 Pain in unspecified knee: Secondary | ICD-10-CM | POA: Insufficient documentation

## 2012-04-03 DIAGNOSIS — M545 Low back pain, unspecified: Secondary | ICD-10-CM | POA: Insufficient documentation

## 2012-04-08 ENCOUNTER — Ambulatory Visit: Payer: Medicare Other | Attending: Physical Medicine and Rehabilitation | Admitting: Physical Therapy

## 2012-04-08 DIAGNOSIS — IMO0001 Reserved for inherently not codable concepts without codable children: Secondary | ICD-10-CM | POA: Insufficient documentation

## 2012-04-08 DIAGNOSIS — M25569 Pain in unspecified knee: Secondary | ICD-10-CM | POA: Insufficient documentation

## 2012-04-08 DIAGNOSIS — M545 Low back pain, unspecified: Secondary | ICD-10-CM | POA: Insufficient documentation

## 2012-04-11 ENCOUNTER — Ambulatory Visit: Payer: Medicare Other | Admitting: Physical Therapy

## 2012-04-15 ENCOUNTER — Ambulatory Visit: Payer: Medicare Other | Admitting: Physical Therapy

## 2012-04-17 ENCOUNTER — Ambulatory Visit: Payer: Medicare Other | Admitting: Physical Therapy

## 2012-04-23 ENCOUNTER — Ambulatory Visit: Payer: Medicare Other | Admitting: Physical Therapy

## 2012-04-25 ENCOUNTER — Ambulatory Visit: Payer: Medicare Other | Admitting: Physical Therapy

## 2012-04-30 ENCOUNTER — Ambulatory Visit: Payer: Medicare Other | Admitting: Physical Therapy

## 2012-05-03 ENCOUNTER — Ambulatory Visit: Payer: Medicare Other | Admitting: Physical Therapy

## 2012-11-11 ENCOUNTER — Ambulatory Visit: Payer: Medicare Other | Admitting: Physical Therapy

## 2013-08-27 ENCOUNTER — Ambulatory Visit: Payer: Medicare Other | Attending: Sports Medicine | Admitting: Physical Therapy

## 2013-08-27 DIAGNOSIS — IMO0001 Reserved for inherently not codable concepts without codable children: Secondary | ICD-10-CM | POA: Insufficient documentation

## 2013-08-27 DIAGNOSIS — R262 Difficulty in walking, not elsewhere classified: Secondary | ICD-10-CM | POA: Insufficient documentation

## 2013-08-27 DIAGNOSIS — M25669 Stiffness of unspecified knee, not elsewhere classified: Secondary | ICD-10-CM | POA: Insufficient documentation

## 2013-08-27 DIAGNOSIS — Z9181 History of falling: Secondary | ICD-10-CM | POA: Insufficient documentation

## 2013-08-27 DIAGNOSIS — M25569 Pain in unspecified knee: Secondary | ICD-10-CM | POA: Insufficient documentation

## 2013-09-01 ENCOUNTER — Ambulatory Visit: Payer: Medicare Other | Admitting: Physical Therapy

## 2013-09-03 ENCOUNTER — Ambulatory Visit: Payer: Medicare Other | Admitting: Physical Therapy

## 2013-09-08 ENCOUNTER — Ambulatory Visit: Payer: Medicare Other | Attending: Sports Medicine | Admitting: Physical Therapy

## 2013-09-08 DIAGNOSIS — M25569 Pain in unspecified knee: Secondary | ICD-10-CM | POA: Insufficient documentation

## 2013-09-08 DIAGNOSIS — IMO0001 Reserved for inherently not codable concepts without codable children: Secondary | ICD-10-CM | POA: Insufficient documentation

## 2013-09-08 DIAGNOSIS — Z9181 History of falling: Secondary | ICD-10-CM | POA: Insufficient documentation

## 2013-09-08 DIAGNOSIS — M25669 Stiffness of unspecified knee, not elsewhere classified: Secondary | ICD-10-CM | POA: Insufficient documentation

## 2013-09-08 DIAGNOSIS — R262 Difficulty in walking, not elsewhere classified: Secondary | ICD-10-CM | POA: Insufficient documentation

## 2013-09-11 ENCOUNTER — Ambulatory Visit: Payer: Medicare Other | Admitting: Physical Therapy

## 2013-09-15 ENCOUNTER — Ambulatory Visit: Payer: Medicare Other | Admitting: Physical Therapy

## 2013-09-19 ENCOUNTER — Ambulatory Visit: Payer: Medicare Other | Admitting: Physical Therapy

## 2013-09-22 ENCOUNTER — Ambulatory Visit: Payer: Medicare Other | Admitting: Physical Therapy

## 2013-09-24 ENCOUNTER — Ambulatory Visit: Payer: Medicare Other | Admitting: Physical Therapy

## 2014-01-22 ENCOUNTER — Emergency Department (HOSPITAL_BASED_OUTPATIENT_CLINIC_OR_DEPARTMENT_OTHER)
Admission: EM | Admit: 2014-01-22 | Discharge: 2014-01-22 | Disposition: A | Discharge status: Home / Self Care | Payer: Medicare Other | Attending: Emergency Medicine | Admitting: Emergency Medicine

## 2014-01-22 ENCOUNTER — Encounter (HOSPITAL_BASED_OUTPATIENT_CLINIC_OR_DEPARTMENT_OTHER): Payer: Self-pay | Admitting: Emergency Medicine

## 2014-01-22 DIAGNOSIS — Z87891 Personal history of nicotine dependence: Secondary | ICD-10-CM | POA: Insufficient documentation

## 2014-01-22 DIAGNOSIS — Y939 Activity, unspecified: Secondary | ICD-10-CM | POA: Insufficient documentation

## 2014-01-22 DIAGNOSIS — I10 Essential (primary) hypertension: Secondary | ICD-10-CM | POA: Insufficient documentation

## 2014-01-22 DIAGNOSIS — IMO0002 Reserved for concepts with insufficient information to code with codable children: Secondary | ICD-10-CM | POA: Insufficient documentation

## 2014-01-22 DIAGNOSIS — S51012A Laceration without foreign body of left elbow, initial encounter: Secondary | ICD-10-CM

## 2014-01-22 DIAGNOSIS — Z79899 Other long term (current) drug therapy: Secondary | ICD-10-CM | POA: Insufficient documentation

## 2014-01-22 DIAGNOSIS — I2510 Atherosclerotic heart disease of native coronary artery without angina pectoris: Secondary | ICD-10-CM | POA: Insufficient documentation

## 2014-01-22 DIAGNOSIS — W1809XA Striking against other object with subsequent fall, initial encounter: Secondary | ICD-10-CM | POA: Insufficient documentation

## 2014-01-22 DIAGNOSIS — Y929 Unspecified place or not applicable: Secondary | ICD-10-CM | POA: Insufficient documentation

## 2014-01-22 DIAGNOSIS — S51009A Unspecified open wound of unspecified elbow, initial encounter: Secondary | ICD-10-CM | POA: Insufficient documentation

## 2014-01-22 NOTE — ED Notes (Addendum)
Fell approx 530pm-left elbow injury-FROM-skin tear noted

## 2014-01-22 NOTE — ED Provider Notes (Signed)
CSN: [id number]     Arrival date & time 01/22/14  1848 History  This chart was scribed for Maria Munchobert Jequan Shahin, MD by Ardelia Memsylan Malpass, ED Scribe. This patient was seen in room MH08/MH08 and the patient's care was started at 7:48 PM.   Chief Complaint  Patient presents with  . Fall    The history is provided by the patient. No language interpreter was used.    HPI Comments: Maria [name] is a 80 y.o. female with a history of HTN, CAD and Afib who presents to the Emergency Department complaining of a fall that occurred about 2 hours ago. She states that she hit her left elbow upon falling and she sustained a skin tear to her left elbow at the time of the fall. Bleeding is controlled by a bandage that pt applied at home. She denies any loss of sensation or strength in her wrist. She denies head injury or LOC pertaining to the fall. She denies sustaining any other injuries at the time of the fall. She states that this is the 10th fall she has had in the past 6 months. She states that she takes Pradaxa.   Past Medical History  Diagnosis Date  . Hypertension   . Coronary artery disease   . Atrial fibrillation    Past Surgical History  Procedure Laterality Date  . Abdominal hysterectomy    . Cholecystectomy    . Rotator cuff repair    . Tonsillectomy    . Oophorectomy    . Cataract extraction, bilateral     No family history on file. History  Substance Use Topics  . Smoking status: Former Games developermoker  . Smokeless tobacco: Not on file  . Alcohol Use: No   OB History   Grav Para Term Preterm Abortions TAB SAB Ect Mult Living                 Review of Systems  All other systems reviewed and are negative.   Allergies  Fosamax and Coumadin  Home Medications   Current Outpatient Rx  Name  Route  Sig  Dispense  Refill  . Escitalopram Oxalate (LEXAPRO PO)   Oral   Take by mouth.         [name]. Polyethyl Glycol-Propyl Glycol (SYSTANE OP)   Ophthalmic   Apply to eye.         [name].  POTASSIUM CHLORIDE PO   Oral   Take by mouth.         [name]. amiodarone (PACERONE) 200 MG tablet   Oral   Take 200 mg by mouth daily.         . Cholecalciferol (VITAMIN D3) 1000 UNITS CAPS   Oral   Take 1 capsule by mouth daily.         . dabigatran (PRADAXA) 150 MG CAPS   Oral   Take 150 mg by mouth every 12 (twelve) hours.         [name]. losartan (COZAAR) 25 MG tablet   Oral   Take 25 mg by mouth daily.         . metoprolol succinate (TOPROL-XL) 50 MG 24 hr tablet   Oral   Take 50 mg by mouth daily. Take with or immediately following a meal.         . omeprazole (PRILOSEC) 20 MG capsule   Oral   Take 20 mg by mouth daily.         . predniSONE (DELTASONE) 5 MG tablet  Oral   Take 5 mg by mouth daily.         . Vitamins/Minerals TABS   Oral   Take 1 tablet by mouth daily. Occuvite          Triage Vitals: BP 168/103  Pulse 57  Temp(Src) 99.3 F (37.4 C) (Oral)  Resp 18  Ht 5\' 2"  (1.575 m)  Wt 132 lb (59.875 kg)  BMI 24.14 kg/m2  SpO2 100%  Physical Exam  Nursing note and vitals reviewed. Constitutional: She is oriented to person, place, and time. She appears well-developed and well-nourished. No distress.  HENT:  Head: Normocephalic and atraumatic.  Eyes: Conjunctivae and EOM are normal.  Neck: No tracheal deviation present.  Cardiovascular: Normal rate, regular rhythm and intact distal pulses.   Pulmonary/Chest: Effort normal. No stridor. No respiratory distress.  Abdominal: She exhibits no distension.  Musculoskeletal: She exhibits no edema.       Left elbow: She exhibits laceration. She exhibits normal range of motion, no swelling, no effusion and no deformity. No tenderness found.       Left wrist: Normal.       Arms: Neurological: She is alert and oriented to person, place, and time. She has normal strength. She displays no atrophy and no tremor. No cranial nerve deficit. She exhibits normal muscle tone. She displays no seizure activity.  Coordination normal.  Skin: Skin is warm and dry.  Psychiatric: She has a normal mood and affect.    ED Course  Procedures (including critical care time)  DIAGNOSTIC STUDIES: Oxygen Saturation is 100% on RA, normal by my interpretation.    COORDINATION OF CARE: 7:52 PM- Performed wound care. Pt advised of plan for treatment and pt agrees.  LACERATION REPAIR Performed by: Maria Munch Authorized by: Maria Munch Consent: Verbal consent obtained. Risks and benefits: risks, benefits and alternatives were discussed Consent given by: patient Patient identity confirmed: provided demographic data Prepped and Draped in normal sterile fashion Wound explored  Laceration Location: L elbow  Laceration Length: 6cm  No Foreign Bodies seen or palpated  Anesthesia: local infiltration    Irrigation method: syringe Amount of cleaning: standard  Skin closure: steri-strips  Number of strips: 4  Technique: as close as possible.  Patient tolerance: Patient tolerated the procedure well with no immediate complications.    MDM   Final diagnoses:  Laceration of elbow, left   I personally performed the services described in this documentation, which was scribed in my presence. The recorded information has been reviewed and is accurate.   This well-appearing elderly female presents after a mechanical fall with laceration of the left elbow.  There is no tenderness to palpation, no restricted range of motion, and the low suspicion for fracture.  Patient tolerated wound repair well, was discharged in stable condition.   Maria Munch, MD 01/22/14 2015

## 2014-01-22 NOTE — Discharge Instructions (Signed)
As discussed, please keep your wound dry until Saturday night.  At that time you may remove the dressing, and wash water over the wound, gently.  Afterwards, please keep the wound dry and clean.  The adhesive strips will fall off when the time is appropriate.  Return here for any concerning changes in your condition.

## 2014-03-22 ENCOUNTER — Emergency Department (HOSPITAL_COMMUNITY): Payer: Medicare Other

## 2014-03-22 ENCOUNTER — Other Ambulatory Visit: Payer: Self-pay

## 2014-03-22 ENCOUNTER — Encounter (HOSPITAL_COMMUNITY): Payer: Self-pay | Admitting: Emergency Medicine

## 2014-03-22 ENCOUNTER — Inpatient Hospital Stay (HOSPITAL_COMMUNITY)
Admission: EM | Admit: 2014-03-22 | Discharge: 2014-04-06 | DRG: 084 | Disposition: E | Payer: Medicare Other | Attending: General Surgery | Admitting: General Surgery

## 2014-03-22 DIAGNOSIS — I619 Nontraumatic intracerebral hemorrhage, unspecified: Secondary | ICD-10-CM

## 2014-03-22 DIAGNOSIS — S069X9A Unspecified intracranial injury with loss of consciousness of unspecified duration, initial encounter: Secondary | ICD-10-CM

## 2014-03-22 DIAGNOSIS — S069XAA Unspecified intracranial injury with loss of consciousness status unknown, initial encounter: Secondary | ICD-10-CM

## 2014-03-22 DIAGNOSIS — S065X9A Traumatic subdural hemorrhage with loss of consciousness of unspecified duration, initial encounter: Secondary | ICD-10-CM

## 2014-03-22 DIAGNOSIS — E876 Hypokalemia: Secondary | ICD-10-CM

## 2014-03-22 DIAGNOSIS — S065XAA Traumatic subdural hemorrhage with loss of consciousness status unknown, initial encounter: Secondary | ICD-10-CM

## 2014-03-22 DIAGNOSIS — E039 Hypothyroidism, unspecified: Secondary | ICD-10-CM | POA: Diagnosis present

## 2014-03-22 DIAGNOSIS — Z79899 Other long term (current) drug therapy: Secondary | ICD-10-CM

## 2014-03-22 DIAGNOSIS — I10 Essential (primary) hypertension: Secondary | ICD-10-CM | POA: Diagnosis present

## 2014-03-22 DIAGNOSIS — S06309A Unspecified focal traumatic brain injury with loss of consciousness of unspecified duration, initial encounter: Principal | ICD-10-CM

## 2014-03-22 DIAGNOSIS — T68XXXA Hypothermia, initial encounter: Secondary | ICD-10-CM

## 2014-03-22 DIAGNOSIS — S066XAA Traumatic subarachnoid hemorrhage with loss of consciousness status unknown, initial encounter: Secondary | ICD-10-CM

## 2014-03-22 DIAGNOSIS — W19XXXA Unspecified fall, initial encounter: Secondary | ICD-10-CM

## 2014-03-22 DIAGNOSIS — I4891 Unspecified atrial fibrillation: Secondary | ICD-10-CM | POA: Diagnosis present

## 2014-03-22 DIAGNOSIS — R680 Hypothermia, not associated with low environmental temperature: Secondary | ICD-10-CM | POA: Diagnosis present

## 2014-03-22 DIAGNOSIS — S02109A Fracture of base of skull, unspecified side, initial encounter for closed fracture: Principal | ICD-10-CM | POA: Diagnosis present

## 2014-03-22 DIAGNOSIS — S066X9A Traumatic subarachnoid hemorrhage with loss of consciousness of unspecified duration, initial encounter: Secondary | ICD-10-CM

## 2014-03-22 DIAGNOSIS — S020XXA Fracture of vault of skull, initial encounter for closed fracture: Secondary | ICD-10-CM

## 2014-03-22 DIAGNOSIS — Y92009 Unspecified place in unspecified non-institutional (private) residence as the place of occurrence of the external cause: Secondary | ICD-10-CM

## 2014-03-22 DIAGNOSIS — R159 Full incontinence of feces: Secondary | ICD-10-CM | POA: Diagnosis present

## 2014-03-22 LAB — ETHANOL: Alcohol, Ethyl (B): <11 mg/dL (ref 0–11)

## 2014-03-22 LAB — I-STAT ARTERIAL BLOOD GAS, ED
Acid-base deficit: 4 mmol/L — ABNORMAL HIGH (ref 0.0–2.0)
Bicarbonate: 22.3 mEq/L (ref 20.0–24.0)
O2 SAT: 100 %
PCO2 ART: 44.2 mmHg (ref 35.0–45.0)
PH ART: 7.31 — AB (ref 7.350–7.450)
Patient temperature: 98.7
TCO2: 24 mmol/L (ref 0–100)
pO2, Arterial: 430 mmHg — ABNORMAL HIGH (ref 80.0–100.0)

## 2014-03-22 LAB — CBC
HCT: 31.5 % — ABNORMAL LOW (ref 36.0–46.0)
HEMOGLOBIN: 10.2 g/dL — AB (ref 12.0–15.0)
MCH: 30.7 pg (ref 26.0–34.0)
MCHC: 32.4 g/dL (ref 30.0–36.0)
MCV: 94.9 fL (ref 78.0–100.0)
Platelets: 244 10*3/uL (ref 150–400)
RBC: 3.32 MIL/uL — ABNORMAL LOW (ref 3.87–5.11)
RDW: 14.4 % (ref 11.5–15.5)
WBC: 26.4 10*3/uL — AB (ref 4.0–10.5)

## 2014-03-22 LAB — I-STAT CHEM 8, ED
BUN: 26 mg/dL — ABNORMAL HIGH (ref 6–23)
CALCIUM ION: 1.04 mmol/L — AB (ref 1.13–1.30)
CHLORIDE: 103 meq/L (ref 96–112)
Creatinine, Ser: 1.5 mg/dL — ABNORMAL HIGH (ref 0.50–1.10)
GLUCOSE: 222 mg/dL — AB (ref 70–99)
HEMATOCRIT: 33 % — AB (ref 36.0–46.0)
Hemoglobin: 11.2 g/dL — ABNORMAL LOW (ref 12.0–15.0)
Potassium: 3 mEq/L — ABNORMAL LOW (ref 3.7–5.3)
Sodium: 140 mEq/L (ref 137–147)
TCO2: 22 mmol/L (ref 0–100)

## 2014-03-22 LAB — URINALYSIS, ROUTINE W REFLEX MICROSCOPIC
BILIRUBIN URINE: NEGATIVE
Glucose, UA: NEGATIVE mg/dL
Hgb urine dipstick: NEGATIVE
KETONES UR: NEGATIVE mg/dL
Leukocytes, UA: NEGATIVE
Nitrite: NEGATIVE
PROTEIN: NEGATIVE mg/dL
Specific Gravity, Urine: 1.019 (ref 1.005–1.030)
Urobilinogen, UA: 0.2 mg/dL (ref 0.0–1.0)
pH: 7 (ref 5.0–8.0)

## 2014-03-22 LAB — COMPREHENSIVE METABOLIC PANEL
ALT: 30 U/L (ref 0–35)
AST: 41 U/L — ABNORMAL HIGH (ref 0–37)
Albumin: 2.7 g/dL — ABNORMAL LOW (ref 3.5–5.2)
Alkaline Phosphatase: 53 U/L (ref 39–117)
BUN: 25 mg/dL — AB (ref 6–23)
CALCIUM: 7.5 mg/dL — AB (ref 8.4–10.5)
CO2: 21 mEq/L (ref 19–32)
Chloride: 102 mEq/L (ref 96–112)
Creatinine, Ser: 1.38 mg/dL — ABNORMAL HIGH (ref 0.50–1.10)
GFR calc non Af Amer: 33 mL/min — ABNORMAL LOW (ref >90)
GFR, EST AFRICAN AMERICAN: 39 mL/min — AB (ref >90)
GLUCOSE: 222 mg/dL — AB (ref 70–99)
Potassium: 3.2 mEq/L — ABNORMAL LOW (ref 3.7–5.3)
SODIUM: 139 meq/L (ref 137–147)
Total Bilirubin: 0.5 mg/dL (ref 0.3–1.2)
Total Protein: 4.9 g/dL — ABNORMAL LOW (ref 6.0–8.3)

## 2014-03-22 LAB — CK TOTAL AND CKMB (NOT AT ARMC)
CK TOTAL: 52 U/L (ref 7–177)
CK, MB: 1.8 ng/mL (ref 0.3–4.0)
Relative Index: INVALID (ref 0.0–2.5)

## 2014-03-22 LAB — I-STAT CG4 LACTIC ACID, ED: Lactic Acid, Venous: 4.89 mmol/L — ABNORMAL HIGH (ref 0.5–2.2)

## 2014-03-22 LAB — PROTIME-INR
INR: 1.62 — ABNORMAL HIGH (ref 0.00–1.49)
PROTHROMBIN TIME: 18.8 s — AB (ref 11.6–15.2)

## 2014-03-22 LAB — CDS SEROLOGY

## 2014-03-22 MED ORDER — LIDOCAINE HCL (CARDIAC) 20 MG/ML IV SOLN
INTRAVENOUS | Status: AC
  Filled 2014-03-22: qty 5

## 2014-03-22 MED ORDER — IOHEXOL 300 MG/ML  SOLN
100.0000 mL | Freq: Once | INTRAMUSCULAR | Status: AC | PRN
  Administered 2014-03-22: 100 mL via INTRAVENOUS

## 2014-03-22 MED ORDER — SUCCINYLCHOLINE CHLORIDE 20 MG/ML IJ SOLN
INTRAMUSCULAR | Status: AC
  Filled 2014-03-22: qty 1

## 2014-03-22 MED ORDER — PHENYLEPHRINE HCL 10 MG/ML IJ SOLN
30.0000 ug/min | INTRAVENOUS | Status: DC
  Filled 2014-03-22 (×2): qty 1

## 2014-03-22 MED ORDER — ROCURONIUM BROMIDE 50 MG/5ML IV SOLN
INTRAVENOUS | Status: AC
  Administered 2014-03-22: 100 mg
  Filled 2014-03-22: qty 2

## 2014-03-22 MED ORDER — ETOMIDATE 2 MG/ML IV SOLN
INTRAVENOUS | Status: AC
  Administered 2014-03-22: 20 mg
  Filled 2014-03-22: qty 20

## 2014-03-22 NOTE — H&P (Signed)
History   Maria [name] is an 80 y.o. female.   Chief Complaint:  Chief Complaint  Patient presents with  . unresponsive level 1     HPI Pt is 80 yo female who presented as a level 1 trauma.  She was found unresponsive in her yard by her neighbors who saw blood on a rock and called 911.  She had GCS 3 throughout EMS and hospital evaluation.  She was incontinent of stool which was noted when she arrived.  She did not feel cold.  She appeared to have vomitus in mouth upon EMS evaluation.   There was suspicion of brain matter on skull on the EMS callout, but it appeared to be congealed blood.    Her family noted that she had been having recent "spells" of passing out and falling in the last few weeks.     PMH: Atrial fibrillation Hypothyroidism ? Hypertension  PSH:   Open chole? C/s vs hysterectomy  No family history on file. Social History: Lives alone  Allergies  Not on File  Home Medications   pradaxa Synthroid Amiodarone Losartan Metoprolol Potassium   Trauma Course   Results for orders placed during the hospital encounter of 03/30/2014 (from the past 48 hour(s))  TYPE AND SCREEN     Status: None   Collection Time    03/10/2014  8:27 PM      Result Value Ref Range   ABO/RH(D) O NEG     Antibody Screen NEG     Sample Expiration 03/25/2014     Unit Number [id number]     Blood Component Type RED CELLS,LR     Unit division 00     Status of Unit ISSUED     Unit tag comment VERBAL ORDERS PER DR WOFFORD     Transfusion Status OK TO TRANSFUSE     Crossmatch Result PENDING     Unit Number [id number]     Blood Component Type RBC LR PHER2     Unit division 00     Status of Unit ISSUED     Unit tag comment VERBAL ORDERS PER DR WOFFORD     Transfusion Status OK TO TRANSFUSE     Crossmatch Result PENDING    PREPARE FRESH FROZEN PLASMA     Status: None   Collection Time    03/21/2014  8:27 PM      Result Value Ref Range   Unit Number [id number]     Blood  Component Type THAWED PLASMA     Unit division 00     Status of Unit ISSUED     Transfusion Status OK TO TRANSFUSE     Unit Number [id number]     Blood Component Type THAWED PLASMA     Unit division 00     Status of Unit ISSUED     Transfusion Status OK TO TRANSFUSE    CDS SEROLOGY     Status: None   Collection Time    04/05/2014  8:57 PM      Result Value Ref Range   CDS serology specimen       Value: SPECIMEN WILL BE HELD FOR 14 DAYS IF TESTING IS REQUIRED  COMPREHENSIVE METABOLIC PANEL     Status: Abnormal   Collection Time    04/04/2014  8:57 PM      Result Value Ref Range   Sodium 139  137 - 147 mEq/L   Potassium 3.2 (*) 3.7 - 5.3 mEq/L   Chloride 102  96 - 112 mEq/L   CO2 21  19 - 32 mEq/L   Glucose, Bld 222 (*) 70 - 99 mg/dL   BUN 25 (*) 6 - 23 mg/dL   Creatinine, Ser 1.38 (*) 0.50 - 1.10 mg/dL   Calcium 7.5 (*) 8.4 - 10.5 mg/dL   Total Protein 4.9 (*) 6.0 - 8.3 g/dL   Albumin 2.7 (*) 3.5 - 5.2 g/dL   AST 41 (*) 0 - 37 U/L   ALT 30  0 - 35 U/L   Alkaline Phosphatase 53  39 - 117 U/L   Total Bilirubin 0.5  0.3 - 1.2 mg/dL   GFR calc non Af Amer 33 (*) >90 mL/min   GFR calc Af Amer 39 (*) >90 mL/min   Comment: (NOTE)     The eGFR has been calculated using the CKD EPI equation.     This calculation has not been validated in all clinical situations.     eGFR's persistently <90 mL/min signify possible Chronic Kidney     Disease.  CBC     Status: Abnormal   Collection Time    03/18/2014  8:57 PM      Result Value Ref Range   WBC 26.4 (*) 4.0 - 10.5 K/uL   RBC 3.32 (*) 3.87 - 5.11 MIL/uL   Hemoglobin 10.2 (*) 12.0 - 15.0 g/dL   HCT 31.5 (*) 36.0 - 46.0 %   MCV 94.9  78.0 - 100.0 fL   MCH 30.7  26.0 - 34.0 pg   MCHC 32.4  30.0 - 36.0 g/dL   RDW 14.4  11.5 - 15.5 %   Platelets 244  150 - 400 K/uL  ETHANOL     Status: None   Collection Time    03/10/2014  8:57 PM      Result Value Ref Range   Alcohol, Ethyl (B) <11  0 - 11 mg/dL   Comment:            LOWEST  DETECTABLE LIMIT FOR     SERUM ALCOHOL IS 11 mg/dL     FOR MEDICAL PURPOSES ONLY  PROTIME-INR     Status: Abnormal   Collection Time    03/21/2014  8:57 PM      Result Value Ref Range   Prothrombin Time 18.8 (*) 11.6 - 15.2 seconds   INR 1.62 (*) 0.00 - 1.49  CK TOTAL AND CKMB     Status: None   Collection Time    03/11/2014  8:57 PM      Result Value Ref Range   Total CK 52  7 - 177 U/L   CK, MB 1.8  0.3 - 4.0 ng/mL   Relative Index RELATIVE INDEX IS INVALID  0.0 - 2.5   Comment: WHEN CK < 100 U/L             I-STAT CHEM 8, ED     Status: Abnormal   Collection Time    03/24/2014  9:05 PM      Result Value Ref Range   Sodium 140  137 - 147 mEq/L   Potassium 3.0 (*) 3.7 - 5.3 mEq/L   Chloride 103  96 - 112 mEq/L   BUN 26 (*) 6 - 23 mg/dL   Creatinine, Ser 1.50 (*) 0.50 - 1.10 mg/dL   Comment: QA FLAGS AND/OR RANGES MODIFIED BY DEMOGRAPHIC UPDATE ON 05/17 AT 2114   Glucose, Bld 222 (*) 70 - 99 mg/dL   Calcium, Ion 1.04 (*) 1.13 -  1.30 mmol/L   TCO2 22  0 - 100 mmol/L   Hemoglobin 11.2 (*) 12.0 - 15.0 g/dL   Comment: QA FLAGS AND/OR RANGES MODIFIED BY DEMOGRAPHIC UPDATE ON 05/17 AT 2114   HCT 33.0 (*) 36.0 - 46.0 %   Comment: QA FLAGS AND/OR RANGES MODIFIED BY DEMOGRAPHIC UPDATE ON 05/17 AT 2114  I-STAT CG4 LACTIC ACID, ED     Status: Abnormal   Collection Time    03/12/2014  9:06 PM      Result Value Ref Range   Lactic Acid, Venous 4.89 (*) 0.5 - 2.2 mmol/L  I-STAT ARTERIAL BLOOD GAS, ED     Status: Abnormal   Collection Time    03/19/2014  9:56 PM      Result Value Ref Range   pH, Arterial 7.310 (*) 7.350 - 7.450   pCO2 arterial 44.2  35.0 - 45.0 mmHg   pO2, Arterial 430.0 (*) 80.0 - 100.0 mmHg   Bicarbonate 22.3  20.0 - 24.0 mEq/L   TCO2 24  0 - 100 mmol/L   O2 Saturation 100.0     Acid-base deficit 4.0 (*) 0.0 - 2.0 mmol/L   Patient temperature 98.7 F     Collection site RADIAL, ALLEN'S TEST ACCEPTABLE     Drawn by RT     Sample type ARTERIAL     Ct Head Wo  Contrast  03/18/2014   CLINICAL DATA:  History of trauma from a fall. Patient is currently on blood thinners.  EXAM: CT HEAD WITHOUT CONTRAST  CT CERVICAL SPINE WITHOUT CONTRAST  TECHNIQUE: Multidetector CT imaging of the head and cervical spine was performed following the standard protocol without intravenous contrast. Multiplanar CT image reconstructions of the cervical spine were also generated.  COMPARISON:  No priors.  FINDINGS: CT HEAD FINDINGS  There is gas and high attenuation fluid in the right occipital scalp, compatible with a scalp laceration/ hematoma. In this area there is a very subtle nondisplaced fracture of the right occipital bone extending toward the foramen magnum. No other acute displaced fracture of the skull is noted at this time. There is a large amount of extra-axial high attenuation fluid compatible with probable subdural hemorrhage overlying the left cerebral convexity, measuring up to 2.1 cm in thickness. In addition, there is high attenuation fluid within the left frontal lobe, compatible with acute intraparenchymal hemorrhage. There is a small amount of subarachnoid hemorrhage in the left temporal region and right frontal region overlying the right frontal convexity. In addition, there is some intraventricular hemorrhage a lying dependently in the atrium of the right lateral ventricle. Severe left to right midline shift (1.5 cm). Some effacement of the basal cisterns is also noted, particularly on the left, which could be indicative of early uncal herniation. There is also some prominence of the temporal horn of the right lateral ventricle, which could suggest some early changes of hydrocephalus related to entrapment. Mastoids are well pneumatized bilaterally. Visualized paranasal sinuses are well pneumatized.  CT CERVICAL SPINE FINDINGS  There is what appears to be an old obliquely oriented type 2 odontoid fracture which has sclerotic margins. There is a small amount of  anterolisthesis of C1 relative to C2 along the odontoid fracture line (3 mm), which may be chronic. Alignment otherwise appears anatomic. No other acute displaced cervical spine fractures are noted. Multilevel degenerative disc disease, most pronounced at C3-C4, C4-C5 and C5-C6. Mild multilevel facet arthropathy. Visualized portions of the upper thorax are unremarkable.  IMPRESSION: 1. Right occipital scalp  laceration/contusion with associated nondisplaced right occipital bone fracture, and contrecoup injury involving the left frontal lobe where there is a large intraparenchymal hemorrhagic contusion, as well as large adjacent left subdural hematoma and bilateral subarachnoid hemorrhage with small amount of intraventricular hemorrhage, and a large amount of left right midline shift with potential early changes of uncal herniation the left. Neurosurgical consultation is suggested if clinically appropriate. 2. No definite evidence of acute traumatic injury to the cervical spine. 3. There is what appears to be an old type 2 odontoid fracture, with a small amount of anterolisthesis of C1 relative to C2 along the fracture line, however, this is favored to be chronic. These results were discussed in person at the time of interpretation on 03/08/2014 at 9:48 PM to Dr. Barry Dienes, who verbally acknowledged these results.   Electronically Signed   By: Vinnie Langton M.D.   On: 03/20/2014 21:58   Ct Chest W Contrast  03/10/2014   CLINICAL DATA:  History of trauma from a fall.  Head injury.  EXAM: CT CHEST, ABDOMEN, AND PELVIS WITH CONTRAST  TECHNIQUE: Multidetector CT imaging of the chest, abdomen and pelvis was performed following the standard protocol during bolus administration of intravenous contrast.  CONTRAST:  175m OMNIPAQUE IOHEXOL 300 MG/ML  SOLN  COMPARISON:  No priors.  FINDINGS: CT CHEST FINDINGS  Mediastinum: Patient is intubated with the tip of the endotracheal to approximately 2 cm above the carina.  Nasogastric tube extends into the stomach. Heart size is normal. No abnormal high attenuation fluid within the mediastinum to suggest posttraumatic mediastinal hematoma. No evidence of posttraumatic aortic dissection/transection. Trace amount of pericardial fluid and/or thickening, unlikely to be of hemodynamic significance at this time. No pathologically enlarged mediastinal or hilar lymph nodes. Esophagus is unremarkable in appearance.  Lungs/Pleura: Small amount of ground-glass attenuation in the posterior aspect of the left upper lobe, favored to reflect sequela of mild aspiration, as there is some fluid and the distal trachea extending into the left mainstem bronchus and left upper lobe bronchi. No other consolidative airspace disease to suggest sequela of pulmonary contusion. No pneumothorax. No suspicious appearing pulmonary nodules or masses.  Musculoskeletal: No acute displaced fractures or aggressive appearing lytic or blastic lesions are noted in the visualized portions of the skeleton.  CT ABDOMEN AND PELVIS FINDINGS  Abdomen/Pelvis: Mild intrahepatic biliary ductal dilatation. No focal hepatic lesions are noted. Status post cholecystectomy. Common bile duct is also dilated measuring approximately 18 mm in the porta hepatis. This ductal dilatation is favored to be functional this post cholecystectomy patient. The appearance of the pancreas, spleen and bilateral adrenal glands is unremarkable. Mild multifocal cortical thinning in the kidneys bilaterally, compatible with mild scarring. Multiple subcentimeter low attenuation lesions in the kidneys bilaterally are too small to definitively characterize, but are statistically likely to represent small cysts. No abnormal high attenuation fluid collection within the peritoneal cavity or retroperitoneum to suggest posttraumatic hemorrhage. No evidence of acute posttraumatic dissection/transsection of the abdominal aorta or major abdominal and pelvic arterial  branches.  Status post hysterectomy. Ovaries are not confidently identified may be surgically absent or atrophic. Urinary bladder is unremarkable in appearance. No significant volume of ascites. No pneumoperitoneum. No pathologic distention of small bowel.  Musculoskeletal: Chronic appearing compression fractures at L1 and L2, with approximately 50% loss of anterior vertebral body height at L1 and approximately 20% loss of anterior vertebral body height at L2. No acute displaced fractures or aggressive appearing lytic or blastic lesions are noted in the  visualized portions of the skeleton.  IMPRESSION: 1. No evidence of significant acute traumatic injury to the chest, abdomen or pelvis. 2. Fluid in the trachea and left mainstem bronchus extending into the left upper lobe bronchi with some dependent ground-glass attenuation in the posterior aspect of the left upper lobe, likely to represent developing aspiration pneumonia/pneumonitis. 3. Additional incidental findings, as above.   Electronically Signed   By: Vinnie Langton M.D.   On: 03/09/2014 22:07   Ct Cervical Spine Wo Contrast  03/24/2014   CLINICAL DATA:  History of trauma from a fall. Patient is currently on blood thinners.  EXAM: CT HEAD WITHOUT CONTRAST  CT CERVICAL SPINE WITHOUT CONTRAST  TECHNIQUE: Multidetector CT imaging of the head and cervical spine was performed following the standard protocol without intravenous contrast. Multiplanar CT image reconstructions of the cervical spine were also generated.  COMPARISON:  No priors.  FINDINGS: CT HEAD FINDINGS  There is gas and high attenuation fluid in the right occipital scalp, compatible with a scalp laceration/ hematoma. In this area there is a very subtle nondisplaced fracture of the right occipital bone extending toward the foramen magnum. No other acute displaced fracture of the skull is noted at this time. There is a large amount of extra-axial high attenuation fluid compatible with probable  subdural hemorrhage overlying the left cerebral convexity, measuring up to 2.1 cm in thickness. In addition, there is high attenuation fluid within the left frontal lobe, compatible with acute intraparenchymal hemorrhage. There is a small amount of subarachnoid hemorrhage in the left temporal region and right frontal region overlying the right frontal convexity. In addition, there is some intraventricular hemorrhage a lying dependently in the atrium of the right lateral ventricle. Severe left to right midline shift (1.5 cm). Some effacement of the basal cisterns is also noted, particularly on the left, which could be indicative of early uncal herniation. There is also some prominence of the temporal horn of the right lateral ventricle, which could suggest some early changes of hydrocephalus related to entrapment. Mastoids are well pneumatized bilaterally. Visualized paranasal sinuses are well pneumatized.  CT CERVICAL SPINE FINDINGS  There is what appears to be an old obliquely oriented type 2 odontoid fracture which has sclerotic margins. There is a small amount of anterolisthesis of C1 relative to C2 along the odontoid fracture line (3 mm), which may be chronic. Alignment otherwise appears anatomic. No other acute displaced cervical spine fractures are noted. Multilevel degenerative disc disease, most pronounced at C3-C4, C4-C5 and C5-C6. Mild multilevel facet arthropathy. Visualized portions of the upper thorax are unremarkable.  IMPRESSION: 1. Right occipital scalp laceration/contusion with associated nondisplaced right occipital bone fracture, and contrecoup injury involving the left frontal lobe where there is a large intraparenchymal hemorrhagic contusion, as well as large adjacent left subdural hematoma and bilateral subarachnoid hemorrhage with small amount of intraventricular hemorrhage, and a large amount of left right midline shift with potential early changes of uncal herniation the left. Neurosurgical  consultation is suggested if clinically appropriate. 2. No definite evidence of acute traumatic injury to the cervical spine. 3. There is what appears to be an old type 2 odontoid fracture, with a small amount of anterolisthesis of C1 relative to C2 along the fracture line, however, this is favored to be chronic. These results were discussed in person at the time of interpretation on 03/17/2014 at 9:48 PM to Dr. Barry Dienes, who verbally acknowledged these results.   Electronically Signed   By: Mauri Brooklyn.D.  On: 03/18/2014 21:58   Ct Abdomen Pelvis W Contrast  03/06/2014   CLINICAL DATA:  History of trauma from a fall.  Head injury.  EXAM: CT CHEST, ABDOMEN, AND PELVIS WITH CONTRAST  TECHNIQUE: Multidetector CT imaging of the chest, abdomen and pelvis was performed following the standard protocol during bolus administration of intravenous contrast.  CONTRAST:  153m OMNIPAQUE IOHEXOL 300 MG/ML  SOLN  COMPARISON:  No priors.  FINDINGS: CT CHEST FINDINGS  Mediastinum: Patient is intubated with the tip of the endotracheal to approximately 2 cm above the carina. Nasogastric tube extends into the stomach. Heart size is normal. No abnormal high attenuation fluid within the mediastinum to suggest posttraumatic mediastinal hematoma. No evidence of posttraumatic aortic dissection/transection. Trace amount of pericardial fluid and/or thickening, unlikely to be of hemodynamic significance at this time. No pathologically enlarged mediastinal or hilar lymph nodes. Esophagus is unremarkable in appearance.  Lungs/Pleura: Small amount of ground-glass attenuation in the posterior aspect of the left upper lobe, favored to reflect sequela of mild aspiration, as there is some fluid and the distal trachea extending into the left mainstem bronchus and left upper lobe bronchi. No other consolidative airspace disease to suggest sequela of pulmonary contusion. No pneumothorax. No suspicious appearing pulmonary nodules or masses.   Musculoskeletal: No acute displaced fractures or aggressive appearing lytic or blastic lesions are noted in the visualized portions of the skeleton.  CT ABDOMEN AND PELVIS FINDINGS  Abdomen/Pelvis: Mild intrahepatic biliary ductal dilatation. No focal hepatic lesions are noted. Status post cholecystectomy. Common bile duct is also dilated measuring approximately 18 mm in the porta hepatis. This ductal dilatation is favored to be functional this post cholecystectomy patient. The appearance of the pancreas, spleen and bilateral adrenal glands is unremarkable. Mild multifocal cortical thinning in the kidneys bilaterally, compatible with mild scarring. Multiple subcentimeter low attenuation lesions in the kidneys bilaterally are too small to definitively characterize, but are statistically likely to represent small cysts. No abnormal high attenuation fluid collection within the peritoneal cavity or retroperitoneum to suggest posttraumatic hemorrhage. No evidence of acute posttraumatic dissection/transsection of the abdominal aorta or major abdominal and pelvic arterial branches.  Status post hysterectomy. Ovaries are not confidently identified may be surgically absent or atrophic. Urinary bladder is unremarkable in appearance. No significant volume of ascites. No pneumoperitoneum. No pathologic distention of small bowel.  Musculoskeletal: Chronic appearing compression fractures at L1 and L2, with approximately 50% loss of anterior vertebral body height at L1 and approximately 20% loss of anterior vertebral body height at L2. No acute displaced fractures or aggressive appearing lytic or blastic lesions are noted in the visualized portions of the skeleton.  IMPRESSION: 1. No evidence of significant acute traumatic injury to the chest, abdomen or pelvis. 2. Fluid in the trachea and left mainstem bronchus extending into the left upper lobe bronchi with some dependent ground-glass attenuation in the posterior aspect of the  left upper lobe, likely to represent developing aspiration pneumonia/pneumonitis. 3. Additional incidental findings, as above.   Electronically Signed   By: DVinnie LangtonM.D.   On: 03/17/2014 22:07   Dg Pelvis Portable  04/04/2014   CLINICAL DATA:  Status post fall.  Unresponsive.  EXAM: PORTABLE PELVIS 1-2 VIEWS  COMPARISON:  None.  FINDINGS: There is no evidence of pelvic fracture or diastasis. Soft tissues are normal.  IMPRESSION: No acute fracture or dislocation is identified.   Electronically Signed   By: WAbelardo DieselM.D.   On: 03/15/2014 21:48   Dg Chest  Portable 1 View  04/02/2014   CLINICAL DATA:  Status post fall, unresponsive  EXAM: PORTABLE CHEST - 1 VIEW  COMPARISON:  None.  FINDINGS: The heart size and mediastinal contours are within normal limits. There is no focal infiltrate, pulmonary edema, or pleural effusion. Endotracheal tube is identified distal tip 5.7 cm from carina. There is no pneumothorax identified in visualized portions of the lungs but the right apex is not completely included. The visualized skeletal structures are unremarkable.  IMPRESSION: No active cardiopulmonary disease. Endotracheal tube identified distal tip 5.7 cm from carina.   Electronically Signed   By: Abelardo Diesel M.D.   On: 04/03/2014 21:50    Review of Systems  Unable to perform ROS: patient unresponsive    Blood pressure 110/37, pulse 63, temperature 98.7 F (37.1 C), resp. rate 14, height _0  (1.575 m), weight 140 lb (63.504 kg), SpO2 100.00%. Physical Exam  Constitutional: She appears well-developed. She appears lethargic. No distress. Cervical collar and backboard in place.  thin  HENT:  Head:    Right Ear: External ear and ear canal normal.  Left Ear: External ear and ear canal normal.  Nose: Nose normal.  Mouth/Throat: Mucous membranes are pale and dry.  Matted blood all over right side of head.  Dried and congealed.  Laceration over posterior head.   Vomitus in mouth  Eyes:  Conjunctivae are normal. No scleral icterus. Right pupil is not reactive. Left pupil is not reactive.  Pupils 4-5 mm equal bilaterally  Neck: Trachea normal. Carotid bruit is not present.  Cardiovascular: Normal rate, regular rhythm, normal heart sounds and intact distal pulses.  Exam reveals no gallop and no friction rub.   No murmur heard. Respiratory: No respiratory distress. She has no wheezes. She has no rales.  Maintaining airway, inadequate breathing.    GI: Soft. Bowel sounds are normal. She exhibits no distension, no pulsatile liver, no fluid wave, no abdominal bruit, no ascites and no mass. There is no hepatosplenomegaly. There is no rebound and no guarding. No hernia.  RUQ and pfannenstiel incisions.    Genitourinary: No breast swelling or bleeding.  Musculoskeletal: She exhibits no edema.  Neurological: She appears lethargic. GCS eye subscore is 1. GCS verbal subscore is 1. GCS motor subscore is 1.  + cough reflex/gag reflex.  No posturing seen.   Incontinent.    Skin: She is not diaphoretic.     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    Neurosurgery consult Attempt to avoid hypotension and hypoxia Warm up room, bair hugger Replete potassium    Maria [name] 03/07/2014, 10:09 PM   Procedures

## 2014-03-22 NOTE — ED Notes (Signed)
Getting ready to intubate 

## 2014-03-22 NOTE — ED Notes (Signed)
returmed from c-t  Family ahs arrived

## 2014-03-22 NOTE — ED Notes (Signed)
dificulty inserting og

## 2014-03-22 NOTE — ED Notes (Signed)
Rt drew  Abg.  Dr Donell Beersbyerly talking to the family

## 2014-03-22 NOTE — ED Notes (Signed)
xrays  Portables being done ems inserted an io in her lt tibia

## 2014-03-22 NOTE — ED Notes (Signed)
[place] donor called if brain death testing is going to occur  icu to call .  If not icu to call when the pt expires.  refferal Number [phone] becky contact

## 2014-03-22 NOTE — ED Notes (Signed)
1st liter of nss has infused.  1 liter hanging at present  wo

## 2014-03-22 NOTE — ED Notes (Signed)
I stat lactic and chem 8 results given to Dr. Loretha StaplerWofford by B. Bing PlumeHaynes, EMT

## 2014-03-22 NOTE — ED Notes (Signed)
Etomidate  20mg  iv janelle rn  Roc 100mg   Given iv janelle rn

## 2014-03-22 NOTE — ED Notes (Signed)
Intubated 7.5 tube by the ed res

## 2014-03-22 NOTE — ED Notes (Signed)
Report called to 3100s rn

## 2014-03-22 NOTE — ED Notes (Signed)
Low temp and bp discussed with dr Donell Beersbyerly  And dr pool.  Orders no neo drip .  bair hugger may not be  needed

## 2014-03-22 NOTE — ED Notes (Signed)
Dr Donell Beersbyerly notified of temp.  bair hugger ordered.  Dr pool will see

## 2014-03-22 NOTE — ED Notes (Signed)
To 3100 

## 2014-03-22 NOTE — ED Notes (Signed)
To ct

## 2014-03-22 NOTE — ED Notes (Signed)
The pt arrived by gems  Found in the back yard unresponsive she fell struck her head on a rock

## 2014-03-22 NOTE — ED Notes (Signed)
7.5 tube  23 at teeth

## 2014-03-22 NOTE — Progress Notes (Signed)
Pt. Was transported to 3M04 without any complications.

## 2014-03-22 NOTE — ED Notes (Signed)
og placed by dr Donell Beersbyerly

## 2014-03-22 NOTE — ED Provider Notes (Signed)
CSN: [id number]     Arrival date & time 03/07/2014  2041 History   First MD Initiated Contact with Patient 03/11/2014 2101     Chief Complaint  Patient presents with  . unresponsive level 1       Patient is a 80 y.o. female with unknown PMH who presents after being found unresponsive by neighbor.  Patient was found in yard with bleeding from head.  EMS reported patient was with GCS of 3 and she was brought her emergently.  Family is not present upon arrival and thus history is limited.     (Consider location/radiation/quality/duration/timing/severity/associated sxs/prior Treatment) Patient is a 80 y.o. female presenting with general illness. The history is provided by the EMS personnel, medical records and a relative. No language interpreter was used.  Illness Severity:  Severe Onset quality:  Unable to specify Timing:  Unable to specify Chronicity:  New   History reviewed. No pertinent past medical history. History reviewed. No pertinent past surgical history. No family history on file. History  Substance Use Topics  . Smoking status: Never Smoker   . Smokeless tobacco: Not on file  . Alcohol Use: No   OB History   Grav Para Term Preterm Abortions TAB SAB Ect Mult Living                 Review of Systems  Unable to perform ROS     Allergies  Review of patient's allergies indicates not on file.  Home Medications   Prior to Admission medications   Not on File   BP 158/82  Pulse 74  Temp(Src) 98.7 F (37.1 C)  SpO2 100% Physical Exam  Nursing note and vitals reviewed. Constitutional: She appears well-developed and well-nourished.  HENT:  Head: Normocephalic.    Eyes:  4mm pupils bilaterally and non reactive.  Neck: No tracheal deviation present.  Cardiovascular: Normal rate and regular rhythm.   Pulmonary/Chest: No respiratory distress. She has no wheezes.  Patient with agonal breathing  Abdominal: Soft. Bowel sounds are normal. She exhibits no distension  and no mass.  Musculoskeletal: She exhibits no edema and no tenderness.  Lymphadenopathy:    She has no cervical adenopathy.  Neurological: GCS eye subscore is 1. GCS verbal subscore is 1. GCS motor subscore is 1.  Skin: Skin is warm.    ED Course  INTUBATION Date/Time: 03/19/2014 9:04 PM Performed by: Johnney Ou Authorized by: Blake Divine DAVID III Consent: The procedure was performed in an emergent situation. Patient identity confirmed: arm band Time out: Immediately prior to procedure a "time out" was called to verify the correct patient, procedure, equipment, support staff and site/side marked as required. Indications: airway protection Intubation method: video-assisted Patient status: paralyzed (RSI) Sedatives: etomidate Paralytic: rocuronium Tube size: 7.5 mm Tube type: cuffed Number of attempts: 1 Cricoid pressure: no Cords visualized: yes Post-procedure assessment: chest rise and ETCO2 monitor Breath sounds: equal Cuff inflated: yes ETT to lip: 24 cm Tube secured with: ETT holder Chest x-ray interpreted by me and other physician. Chest x-ray findings: endotracheal tube in appropriate position Patient tolerance: Patient tolerated the procedure well with no immediate complications.   (including critical care time) Labs Review Labs Reviewed  COMPREHENSIVE METABOLIC PANEL - Abnormal; Notable for the following:    Potassium 3.2 (*)    Glucose, Bld 222 (*)    BUN 25 (*)    Creatinine, Ser 1.38 (*)    Calcium 7.5 (*)    Total Protein 4.9 (*)  Albumin 2.7 (*)    AST 41 (*)    GFR calc non Af Amer 33 (*)    GFR calc Af Amer 39 (*)    All other components within normal limits  CBC - Abnormal; Notable for the following:    WBC 26.4 (*)    RBC 3.32 (*)    Hemoglobin 10.2 (*)    HCT 31.5 (*)    All other components within normal limits  PROTIME-INR - Abnormal; Notable for the following:    Prothrombin Time 18.8 (*)    INR 1.62 (*)    All other components  within normal limits  I-STAT CHEM 8, ED - Abnormal; Notable for the following:    Potassium 3.0 (*)    BUN 26 (*)    Creatinine, Ser 1.50 (*)    Glucose, Bld 222 (*)    Calcium, Ion 1.04 (*)    Hemoglobin 11.2 (*)    HCT 33.0 (*)    All other components within normal limits  I-STAT CG4 LACTIC ACID, ED - Abnormal; Notable for the following:    Lactic Acid, Venous 4.89 (*)    All other components within normal limits  I-STAT ARTERIAL BLOOD GAS, ED - Abnormal; Notable for the following:    pH, Arterial 7.310 (*)    pO2, Arterial 430.0 (*)    Acid-base deficit 4.0 (*)    All other components within normal limits  URINE CULTURE  CDS SEROLOGY  ETHANOL  CK TOTAL AND CKMB  URINALYSIS, ROUTINE W REFLEX MICROSCOPIC  BLOOD GAS, ARTERIAL  TYPE AND SCREEN  PREPARE FRESH FROZEN PLASMA  ABO/RH    Imaging Review Ct Head Wo Contrast  22-Aug-2014   CLINICAL DATA:  History of trauma from a fall. Patient is currently on blood thinners.  EXAM: CT HEAD WITHOUT CONTRAST  CT CERVICAL SPINE WITHOUT CONTRAST  TECHNIQUE: Multidetector CT imaging of the head and cervical spine was performed following the standard protocol without intravenous contrast. Multiplanar CT image reconstructions of the cervical spine were also generated.  COMPARISON:  No priors.  FINDINGS: CT HEAD FINDINGS  There is gas and high attenuation fluid in the right occipital scalp, compatible with a scalp laceration/ hematoma. In this area there is a very subtle nondisplaced fracture of the right occipital bone extending toward the foramen magnum. No other acute displaced fracture of the skull is noted at this time. There is a large amount of extra-axial high attenuation fluid compatible with probable subdural hemorrhage overlying the left cerebral convexity, measuring up to 2.1 cm in thickness. In addition, there is high attenuation fluid within the left frontal lobe, compatible with acute intraparenchymal hemorrhage. There is a small amount  of subarachnoid hemorrhage in the left temporal region and right frontal region overlying the right frontal convexity. In addition, there is some intraventricular hemorrhage a lying dependently in the atrium of the right lateral ventricle. Severe left to right midline shift (1.5 cm). Some effacement of the basal cisterns is also noted, particularly on the left, which could be indicative of early uncal herniation. There is also some prominence of the temporal horn of the right lateral ventricle, which could suggest some early changes of hydrocephalus related to entrapment. Mastoids are well pneumatized bilaterally. Visualized paranasal sinuses are well pneumatized.  CT CERVICAL SPINE FINDINGS  There is what appears to be an old obliquely oriented type 2 odontoid fracture which has sclerotic margins. There is a small amount of anterolisthesis of C1 relative to C2 along the  odontoid fracture line (3 mm), which may be chronic. Alignment otherwise appears anatomic. No other acute displaced cervical spine fractures are noted. Multilevel degenerative disc disease, most pronounced at C3-C4, C4-C5 and C5-C6. Mild multilevel facet arthropathy. Visualized portions of the upper thorax are unremarkable.  IMPRESSION: 1. Right occipital scalp laceration/contusion with associated nondisplaced right occipital bone fracture, and contrecoup injury involving the left frontal lobe where there is a large intraparenchymal hemorrhagic contusion, as well as large adjacent left subdural hematoma and bilateral subarachnoid hemorrhage with small amount of intraventricular hemorrhage, and a large amount of left right midline shift with potential early changes of uncal herniation the left. Neurosurgical consultation is suggested if clinically appropriate. 2. No definite evidence of acute traumatic injury to the cervical spine. 3. There is what appears to be an old type 2 odontoid fracture, with a small amount of anterolisthesis of C1 relative to  C2 along the fracture line, however, this is favored to be chronic. These results were discussed in person at the time of interpretation on 04/16/2014 at 9:48 PM to Dr. Donell Beers, who verbally acknowledged these results.   Electronically Signed   By: Trudie Reed M.D.   On: 04-16-2014 21:58   Ct Chest W Contrast  04/16/14   CLINICAL DATA:  History of trauma from a fall.  Head injury.  EXAM: CT CHEST, ABDOMEN, AND PELVIS WITH CONTRAST  TECHNIQUE: Multidetector CT imaging of the chest, abdomen and pelvis was performed following the standard protocol during bolus administration of intravenous contrast.  CONTRAST:  OMNIPAQUE IOHEXOL 300 MG/ML  SOLN  COMPARISON:  No priors.  FINDINGS: CT CHEST FINDINGS  Mediastinum: Patient is intubated with the tip of the endotracheal to approximately 2 cm above the carina. Nasogastric tube extends into the stomach. Heart size is normal. No abnormal high attenuation fluid within the mediastinum to suggest posttraumatic mediastinal hematoma. No evidence of posttraumatic aortic dissection/transection. Trace amount of pericardial fluid and/or thickening, unlikely to be of hemodynamic significance at this time. No pathologically enlarged mediastinal or hilar lymph nodes. Esophagus is unremarkable in appearance.  Lungs/Pleura: Small amount of ground-glass attenuation in the posterior aspect of the left upper lobe, favored to reflect sequela of mild aspiration, as there is some fluid and the distal trachea extending into the left mainstem bronchus and left upper lobe bronchi. No other consolidative airspace disease to suggest sequela of pulmonary contusion. No pneumothorax. No suspicious appearing pulmonary nodules or masses.  Musculoskeletal: No acute displaced fractures or aggressive appearing lytic or blastic lesions are noted in the visualized portions of the skeleton.  CT ABDOMEN AND PELVIS FINDINGS  Abdomen/Pelvis: Mild intrahepatic biliary ductal dilatation. No focal hepatic  lesions are noted. Status post cholecystectomy. Common bile duct is also dilated measuring approximately 18 mm in the porta hepatis. This ductal dilatation is favored to be functional this post cholecystectomy patient. The appearance of the pancreas, spleen and bilateral adrenal glands is unremarkable. Mild multifocal cortical thinning in the kidneys bilaterally, compatible with mild scarring. Multiple subcentimeter low attenuation lesions in the kidneys bilaterally are too small to definitively characterize, but are statistically likely to represent small cysts. No abnormal high attenuation fluid collection within the peritoneal cavity or retroperitoneum to suggest posttraumatic hemorrhage. No evidence of acute posttraumatic dissection/transsection of the abdominal aorta or major abdominal and pelvic arterial branches.  Status post hysterectomy. Ovaries are not confidently identified may be surgically absent or atrophic. Urinary bladder is unremarkable in appearance. No significant volume of ascites. No pneumoperitoneum. No pathologic  distention of small bowel.  Musculoskeletal: Chronic appearing compression fractures at L1 and L2, with approximately 50% loss of anterior vertebral body height at L1 and approximately 20% loss of anterior vertebral body height at L2. No acute displaced fractures or aggressive appearing lytic or blastic lesions are noted in the visualized portions of the skeleton.  IMPRESSION: 1. No evidence of significant acute traumatic injury to the chest, abdomen or pelvis. 2. Fluid in the trachea and left mainstem bronchus extending into the left upper lobe bronchi with some dependent ground-glass attenuation in the posterior aspect of the left upper lobe, likely to represent developing aspiration pneumonia/pneumonitis. 3. Additional incidental findings, as above.   Electronically Signed   By: Trudie Reed M.D.   On: 03/06/2014 22:07   Ct Cervical Spine Wo Contrast  03/27/2014   CLINICAL  DATA:  History of trauma from a fall. Patient is currently on blood thinners.  EXAM: CT HEAD WITHOUT CONTRAST  CT CERVICAL SPINE WITHOUT CONTRAST  TECHNIQUE: Multidetector CT imaging of the head and cervical spine was performed following the standard protocol without intravenous contrast. Multiplanar CT image reconstructions of the cervical spine were also generated.  COMPARISON:  No priors.  FINDINGS: CT HEAD FINDINGS  There is gas and high attenuation fluid in the right occipital scalp, compatible with a scalp laceration/ hematoma. In this area there is a very subtle nondisplaced fracture of the right occipital bone extending toward the foramen magnum. No other acute displaced fracture of the skull is noted at this time. There is a large amount of extra-axial high attenuation fluid compatible with probable subdural hemorrhage overlying the left cerebral convexity, measuring up to 2.1 cm in thickness. In addition, there is high attenuation fluid within the left frontal lobe, compatible with acute intraparenchymal hemorrhage. There is a small amount of subarachnoid hemorrhage in the left temporal region and right frontal region overlying the right frontal convexity. In addition, there is some intraventricular hemorrhage a lying dependently in the atrium of the right lateral ventricle. Severe left to right midline shift (1.5 cm). Some effacement of the basal cisterns is also noted, particularly on the left, which could be indicative of early uncal herniation. There is also some prominence of the temporal horn of the right lateral ventricle, which could suggest some early changes of hydrocephalus related to entrapment. Mastoids are well pneumatized bilaterally. Visualized paranasal sinuses are well pneumatized.  CT CERVICAL SPINE FINDINGS  There is what appears to be an old obliquely oriented type 2 odontoid fracture which has sclerotic margins. There is a small amount of anterolisthesis of C1 relative to C2 along the  odontoid fracture line (3 mm), which may be chronic. Alignment otherwise appears anatomic. No other acute displaced cervical spine fractures are noted. Multilevel degenerative disc disease, most pronounced at C3-C4, C4-C5 and C5-C6. Mild multilevel facet arthropathy. Visualized portions of the upper thorax are unremarkable.  IMPRESSION: 1. Right occipital scalp laceration/contusion with associated nondisplaced right occipital bone fracture, and contrecoup injury involving the left frontal lobe where there is a large intraparenchymal hemorrhagic contusion, as well as large adjacent left subdural hematoma and bilateral subarachnoid hemorrhage with small amount of intraventricular hemorrhage, and a large amount of left right midline shift with potential early changes of uncal herniation the left. Neurosurgical consultation is suggested if clinically appropriate. 2. No definite evidence of acute traumatic injury to the cervical spine. 3. There is what appears to be an old type 2 odontoid fracture, with a small amount of anterolisthesis of  C1 relative to C2 along the fracture line, however, this is favored to be chronic. These results were discussed in person at the time of interpretation on 03/16/2014 at 9:48 PM to Dr. Donell BeersByerly, who verbally acknowledged these results.   Electronically Signed   By: Trudie Reedaniel  Entrikin M.D.   On: 04/01/2014 21:58   Ct Abdomen Pelvis W Contrast  03/27/2014   CLINICAL DATA:  History of trauma from a fall.  Head injury.  EXAM: CT CHEST, ABDOMEN, AND PELVIS WITH CONTRAST  TECHNIQUE: Multidetector CT imaging of the chest, abdomen and pelvis was performed following the standard protocol during bolus administration of intravenous contrast.  CONTRAST:  100mL OMNIPAQUE IOHEXOL 300 MG/ML  SOLN  COMPARISON:  No priors.  FINDINGS: CT CHEST FINDINGS  Mediastinum: Patient is intubated with the tip of the endotracheal to approximately 2 cm above the carina. Nasogastric tube extends into the stomach. Heart  size is normal. No abnormal high attenuation fluid within the mediastinum to suggest posttraumatic mediastinal hematoma. No evidence of posttraumatic aortic dissection/transection. Trace amount of pericardial fluid and/or thickening, unlikely to be of hemodynamic significance at this time. No pathologically enlarged mediastinal or hilar lymph nodes. Esophagus is unremarkable in appearance.  Lungs/Pleura: Small amount of ground-glass attenuation in the posterior aspect of the left upper lobe, favored to reflect sequela of mild aspiration, as there is some fluid and the distal trachea extending into the left mainstem bronchus and left upper lobe bronchi. No other consolidative airspace disease to suggest sequela of pulmonary contusion. No pneumothorax. No suspicious appearing pulmonary nodules or masses.  Musculoskeletal: No acute displaced fractures or aggressive appearing lytic or blastic lesions are noted in the visualized portions of the skeleton.  CT ABDOMEN AND PELVIS FINDINGS  Abdomen/Pelvis: Mild intrahepatic biliary ductal dilatation. No focal hepatic lesions are noted. Status post cholecystectomy. Common bile duct is also dilated measuring approximately 18 mm in the porta hepatis. This ductal dilatation is favored to be functional this post cholecystectomy patient. The appearance of the pancreas, spleen and bilateral adrenal glands is unremarkable. Mild multifocal cortical thinning in the kidneys bilaterally, compatible with mild scarring. Multiple subcentimeter low attenuation lesions in the kidneys bilaterally are too small to definitively characterize, but are statistically likely to represent small cysts. No abnormal high attenuation fluid collection within the peritoneal cavity or retroperitoneum to suggest posttraumatic hemorrhage. No evidence of acute posttraumatic dissection/transsection of the abdominal aorta or major abdominal and pelvic arterial branches.  Status post hysterectomy. Ovaries are not  confidently identified may be surgically absent or atrophic. Urinary bladder is unremarkable in appearance. No significant volume of ascites. No pneumoperitoneum. No pathologic distention of small bowel.  Musculoskeletal: Chronic appearing compression fractures at L1 and L2, with approximately 50% loss of anterior vertebral body height at L1 and approximately 20% loss of anterior vertebral body height at L2. No acute displaced fractures or aggressive appearing lytic or blastic lesions are noted in the visualized portions of the skeleton.  IMPRESSION: 1. No evidence of significant acute traumatic injury to the chest, abdomen or pelvis. 2. Fluid in the trachea and left mainstem bronchus extending into the left upper lobe bronchi with some dependent ground-glass attenuation in the posterior aspect of the left upper lobe, likely to represent developing aspiration pneumonia/pneumonitis. 3. Additional incidental findings, as above.   Electronically Signed   By: Trudie Reedaniel  Entrikin M.D.   On: 03/30/2014 22:07   Dg Pelvis Portable  03/12/2014   CLINICAL DATA:  Status post fall.  Unresponsive.  EXAM:  PORTABLE PELVIS 1-2 VIEWS  COMPARISON:  None.  FINDINGS: There is no evidence of pelvic fracture or diastasis. Soft tissues are normal.  IMPRESSION: No acute fracture or dislocation is identified.   Electronically Signed   By: Sherian Rein M.D.   On: 05-Apr-2014 21:48   Dg Chest Portable 1 View  04-05-14   CLINICAL DATA:  Status post fall, unresponsive  EXAM: PORTABLE CHEST - 1 VIEW  COMPARISON:  None.  FINDINGS: The heart size and mediastinal contours are within normal limits. There is no focal infiltrate, pulmonary edema, or pleural effusion. Endotracheal tube is identified distal tip 5.7 cm from carina. There is no pneumothorax identified in visualized portions of the lungs but the right apex is not completely included. The visualized skeletal structures are unremarkable.  IMPRESSION: No active cardiopulmonary disease.  Endotracheal tube identified distal tip 5.7 cm from carina.   Electronically Signed   By: Sherian Rein M.D.   On: 2014/04/05 21:50     EKG Interpretation None      MDM   Final diagnoses:  SDH (subdural hematoma)  Intraparenchymal hemorrhage of brain  Fall    Patient presents to the ED as a level I trauma code after being unresponsive and with obvious head injury.  Patient noted to have GCS of 3 and intubated shortly after arrival per procedure note above.  Head CT showed large amount of L frontal IPH and SDH.  NSU and trauma consulted given findings.  Family arrived and they reported patient was on pradaxa. NSU recs included no acute intervention as injury non survivable and patient's family made patient DNR and wanted to withdraw care.  Patient moved to ICU for further goals of care discussions and extubation.     Johnney Ou, MD 03/17/2014 [phone]

## 2014-03-22 NOTE — Progress Notes (Signed)
Pt. Was transported to CT & back to trauma B without any complications.  

## 2014-03-22 NOTE — ED Notes (Signed)
Joyce GrossKay Cragun cousin of the pt is only family here and the pt does not have any other  Family than a half sister.  She is not in town.  The family reports that the pt is a dnr per her wishes.  She has had passing out spells in the past and a history of passing out spells.   2 rings a bracelet a pair of earrings and a watch given to kay Grennan.  The pts shoes also given to kay Coward,  All clothes were cut off

## 2014-03-22 NOTE — ED Notes (Signed)
Dr pool here  And is talking to the fa,ily.Tedd Sias.  folye cath inserted by Randolm Idoljamelle emt

## 2014-03-23 ENCOUNTER — Encounter (HOSPITAL_BASED_OUTPATIENT_CLINIC_OR_DEPARTMENT_OTHER): Payer: Self-pay | Admitting: Emergency Medicine

## 2014-03-23 LAB — PREPARE FRESH FROZEN PLASMA
UNIT DIVISION: 0
Unit division: 0

## 2014-03-23 LAB — URINE CULTURE
COLONY COUNT: NO GROWTH
Culture: NO GROWTH

## 2014-03-23 LAB — TYPE AND SCREEN
ABO/RH(D): O NEG
Antibody Screen: NEGATIVE
Unit division: 0
Unit division: 0

## 2014-03-23 LAB — ABO/RH: ABO/RH(D): O NEG

## 2014-03-23 NOTE — Progress Notes (Signed)
Family requested for a with drawl of care. IV fluids where discontinued and patient was taken off the vent. With in a 10 minute time frame patient went asystole. Family was at bed side through out the event. Family chaplain present. At 0048 patient no longer had a pulse and I could not auscultate a heart beat. This was verified by a second RN Emelia Salisburyarrie Craver RN

## 2014-03-23 NOTE — ED Provider Notes (Signed)
I saw and evaluated the patient, reviewed the resident's note and I agree with the findings and plan.   EKG Interpretation None      CRITICAL CARE Performed by: Candyce ChurnJohn David Jalan Bodi III   Total critical care time: 40  Critical care time was exclusive of separately billable procedures and treating other patients.  Critical care was necessary to treat or prevent imminent or life-threatening deterioration.  Critical care was time spent personally by me on the following activities: development of treatment plan with patient and/or surrogate as well as nursing, discussions with consultants, evaluation of patient's response to treatment, examination of patient, obtaining history from patient or surrogate, ordering and performing treatments and interventions, ordering and review of laboratory studies, ordering and review of radiographic studies, pulse oximetry and re-evaluation of patient's condition.   80 yo female presenting as a Level I trauma code secondary to an unwitnessed fall.  She was found in her yard unresponsive by a neighbor.  On exam, GCS 3, agonal respirations with course bilateral breath sounds, heart sounds distant, abd soft, matted blood on right posterior skull, pupils 5 mm bilaterally and not reactive.  Intubated during primary survey.  I was present and directly supervised the intubation performed by Dr. Ronald LoboKunz. Imaging demonstrated devastating intracranial hemorrhage.  NSU consulted and felt injury was non survivable.  Pt admitted to ICU for withdrawal of care.    Clinical Impression: 1. SDH (subdural hematoma)   2. Intraparenchymal hemorrhage of brain   3. Fall       Candyce ChurnJohn David Lillyn Wieczorek III, MD 09-Apr-2014 1228

## 2014-03-23 NOTE — ED Provider Notes (Signed)
I saw and evaluated the patient, reviewed the resident's note and I agree with the findings and plan.   EKG Interpretation None        Candyce ChurnJohn David Edessa Jakubowicz III, MD 03/30/2014 1229

## 2014-03-23 NOTE — Progress Notes (Signed)
Per Dr Donell BeersByerly vent D/C with family in the room at bed side.

## 2014-03-23 NOTE — Progress Notes (Signed)
Chaplain responded to level 1 trauma, fall. Presented to family in ED, provided refreshments and compassionate presence. Served as Print production plannerliaison between family and staff. Escorted family to 62M waiting area and accompanied them to pt's bedside. Pt's family and ministers are at bedside. Please page for additional support.   Maurene CapesHillary D Irusta [phone]

## 2014-03-23 NOTE — Consult Note (Signed)
Reason for Consult:[facility]   Referring Physician: ED  Maria [name] is an 80 y.o. female.  HPI: 80 y/o on pradaxa s/p fall.  Unconscious with GCS 3  Past Medical History  Diagnosis Date  . Coronary artery disease     History reviewed. No pertinent past surgical history.  No family history on file.  Social History:  reports that she has never smoked. She does not have any smokeless tobacco history on file. She reports that she does not drink alcohol. Her drug history is not on file.  Allergies:  Allergies  Allergen Reactions  . Sulfa Antibiotics     Medications: I have reviewed the patient's current medications.  Results for orders placed during the hospital encounter of 03/29/2014 (from the past 48 hour(s))  TYPE AND SCREEN     Status: None   Collection Time    04/04/2014  8:57 PM      Result Value Ref Range   ABO/RH(D) O NEG     Antibody Screen NEG     Sample Expiration 03/25/2014     Unit Number [id number]     Blood Component Type RED CELLS,LR     Unit division 00     Status of Unit REL FROM [facility]     Unit tag comment VERBAL ORDERS PER DR [facility]     Transfusion Status OK TO TRANSFUSE     Crossmatch Result NOT NEEDED     Unit Number [id number]     Blood Component Type RBC LR PHER2     Unit division 00     Status of Unit REL FROM [facility]     Unit tag comment VERBAL ORDERS PER DR WOFFORD     Transfusion Status OK TO TRANSFUSE     Crossmatch Result NOT NEEDED    PREPARE FRESH FROZEN PLASMA     Status: None   Collection Time    04/03/2014  8:57 PM      Result Value Ref Range   Unit Number [id number]     Blood Component Type THAWED PLASMA     Unit division 00     Status of Unit REL FROM [facility]     Transfusion Status OK TO TRANSFUSE     Unit Number [id number]     Blood Component Type THAWED PLASMA     Unit division 00     Status of Unit REL FROM [facility]     Transfusion Status OK TO TRANSFUSE    CDS SEROLOGY     Status: None   Collection Time    03/14/2014  8:57  PM      Result Value Ref Range   CDS serology specimen       Value: SPECIMEN WILL BE HELD FOR 14 DAYS IF TESTING IS REQUIRED  COMPREHENSIVE METABOLIC PANEL     Status: Abnormal   Collection Time    03/21/2014  8:57 PM      Result Value Ref Range   Sodium 139  137 - 147 mEq/L   Potassium 3.2 (*) 3.7 - 5.3 mEq/L   Chloride 102  96 - 112 mEq/L   CO2 21  19 - 32 mEq/L   Glucose, Bld 222 (*) 70 - 99 mg/dL   BUN 25 (*) 6 - 23 mg/dL   Creatinine, Ser 1.38 (*) 0.50 - 1.10 mg/dL   Calcium 7.5 (*) 8.4 - 10.5 mg/dL   Total Protein 4.9 (*) 6.0 - 8.3 g/dL   Albumin 2.7 (*) 3.5 - 5.2 g/dL  AST 41 (*) 0 - 37 U/L   ALT 30  0 - 35 U/L   Alkaline Phosphatase 53  39 - 117 U/L   Total Bilirubin 0.5  0.3 - 1.2 mg/dL   GFR calc non Af Amer 33 (*) >90 mL/min   GFR calc Af Amer 39 (*) >90 mL/min   Comment: (NOTE)     The eGFR has been calculated using the CKD EPI equation.     This calculation has not been validated in all clinical situations.     eGFR's persistently <90 mL/min signify possible Chronic Kidney     Disease.  CBC     Status: Abnormal   Collection Time    03/19/2014  8:57 PM      Result Value Ref Range   WBC 26.4 (*) 4.0 - 10.5 K/uL   RBC 3.32 (*) 3.87 - 5.11 MIL/uL   Hemoglobin 10.2 (*) 12.0 - 15.0 g/dL   HCT 31.5 (*) 36.0 - 46.0 %   MCV 94.9  78.0 - 100.0 fL   MCH 30.7  26.0 - 34.0 pg   MCHC 32.4  30.0 - 36.0 g/dL   RDW 14.4  11.5 - 15.5 %   Platelets 244  150 - 400 K/uL  ETHANOL     Status: None   Collection Time    03/24/2014  8:57 PM      Result Value Ref Range   Alcohol, Ethyl (B) <11  0 - 11 mg/dL   Comment:            LOWEST DETECTABLE LIMIT FOR     SERUM ALCOHOL IS 11 mg/dL     FOR MEDICAL PURPOSES ONLY  PROTIME-INR     Status: Abnormal   Collection Time    04/04/2014  8:57 PM      Result Value Ref Range   Prothrombin Time 18.8 (*) 11.6 - 15.2 seconds   INR 1.62 (*) 0.00 - 1.49  CK TOTAL AND CKMB     Status: None   Collection Time    03/06/2014  8:57 PM      Result  Value Ref Range   Total CK 52  7 - 177 U/L   CK, MB 1.8  0.3 - 4.0 ng/mL   Relative Index RELATIVE INDEX IS INVALID  0.0 - 2.5   Comment: WHEN CK < 100 U/L             ABO/RH     Status: None   Collection Time    03/25/2014  8:57 PM      Result Value Ref Range   ABO/RH(D) O NEG    I-STAT CHEM 8, ED     Status: Abnormal   Collection Time    03/24/2014  9:05 PM      Result Value Ref Range   Sodium 140  137 - 147 mEq/L   Potassium 3.0 (*) 3.7 - 5.3 mEq/L   Chloride 103  96 - 112 mEq/L   BUN 26 (*) 6 - 23 mg/dL   Creatinine, Ser 1.50 (*) 0.50 - 1.10 mg/dL   Comment: QA FLAGS AND/OR RANGES MODIFIED BY DEMOGRAPHIC UPDATE ON 05/17 AT 2114   Glucose, Bld 222 (*) 70 - 99 mg/dL   Calcium, Ion 1.04 (*) 1.13 - 1.30 mmol/L   TCO2 22  0 - 100 mmol/L   Hemoglobin 11.2 (*) 12.0 - 15.0 g/dL   Comment: QA FLAGS AND/OR RANGES MODIFIED BY DEMOGRAPHIC UPDATE ON 05/17 AT 2114  HCT 33.0 (*) 36.0 - 46.0 %   Comment: QA FLAGS AND/OR RANGES MODIFIED BY DEMOGRAPHIC UPDATE ON 05/17 AT 2114  I-STAT CG4 LACTIC ACID, ED     Status: Abnormal   Collection Time    03/12/2014  9:06 PM      Result Value Ref Range   Lactic Acid, Venous 4.89 (*) 0.5 - 2.2 mmol/L  I-STAT ARTERIAL BLOOD GAS, ED     Status: Abnormal   Collection Time    03/09/2014  9:56 PM      Result Value Ref Range   pH, Arterial 7.310 (*) 7.350 - 7.450   pCO2 arterial 44.2  35.0 - 45.0 mmHg   pO2, Arterial 430.0 (*) 80.0 - 100.0 mmHg   Bicarbonate 22.3  20.0 - 24.0 mEq/L   TCO2 24  0 - 100 mmol/L   O2 Saturation 100.0     Acid-base deficit 4.0 (*) 0.0 - 2.0 mmol/L   Patient temperature 98.7 F     Collection site RADIAL, ALLEN'S TEST ACCEPTABLE     Drawn by RT     Sample type ARTERIAL    URINALYSIS, ROUTINE W REFLEX MICROSCOPIC     Status: None   Collection Time    03/13/2014 10:26 PM      Result Value Ref Range   Color, Urine YELLOW  YELLOW   APPearance CLEAR  CLEAR   Specific Gravity, Urine 1.019  1.005 - 1.030   pH 7.0  5.0 - 8.0    Glucose, UA NEGATIVE  NEGATIVE mg/dL   Hgb urine dipstick NEGATIVE  NEGATIVE   Bilirubin Urine NEGATIVE  NEGATIVE   Ketones, ur NEGATIVE  NEGATIVE mg/dL   Protein, ur NEGATIVE  NEGATIVE mg/dL   Urobilinogen, UA 0.2  0.0 - 1.0 mg/dL   Nitrite NEGATIVE  NEGATIVE   Leukocytes, UA NEGATIVE  NEGATIVE   Comment: MICROSCOPIC NOT DONE ON URINES WITH NEGATIVE PROTEIN, BLOOD, LEUKOCYTES, NITRITE, OR GLUCOSE <1000 mg/dL.    Ct Head Wo Contrast  03/16/2014   CLINICAL DATA:  History of trauma from a fall. Patient is currently on blood thinners.  EXAM: CT HEAD WITHOUT CONTRAST  CT CERVICAL SPINE WITHOUT CONTRAST  TECHNIQUE: Multidetector CT imaging of the head and cervical spine was performed following the standard protocol without intravenous contrast. Multiplanar CT image reconstructions of the cervical spine were also generated.  COMPARISON:  No priors.  FINDINGS: CT HEAD FINDINGS  There is gas and high attenuation fluid in the right occipital scalp, compatible with a scalp laceration/ hematoma. In this area there is a very subtle nondisplaced fracture of the right occipital bone extending toward the foramen magnum. No other acute displaced fracture of the skull is noted at this time. There is a large amount of extra-axial high attenuation fluid compatible with probable subdural hemorrhage overlying the left cerebral convexity, measuring up to 2.1 cm in thickness. In addition, there is high attenuation fluid within the left frontal lobe, compatible with acute intraparenchymal hemorrhage. There is a small amount of subarachnoid hemorrhage in the left temporal region and right frontal region overlying the right frontal convexity. In addition, there is some intraventricular hemorrhage a lying dependently in the atrium of the right lateral ventricle. Severe left to right midline shift (1.5 cm). Some effacement of the basal cisterns is also noted, particularly on the left, which could be indicative of early uncal  herniation. There is also some prominence of the temporal horn of the right lateral ventricle, which could suggest some early  changes of hydrocephalus related to entrapment. Mastoids are well pneumatized bilaterally. Visualized paranasal sinuses are well pneumatized.  CT CERVICAL SPINE FINDINGS  There is what appears to be an old obliquely oriented type 2 odontoid fracture which has sclerotic margins. There is a small amount of anterolisthesis of C1 relative to C2 along the odontoid fracture line (3 mm), which may be chronic. Alignment otherwise appears anatomic. No other acute displaced cervical spine fractures are noted. Multilevel degenerative disc disease, most pronounced at C3-C4, C4-C5 and C5-C6. Mild multilevel facet arthropathy. Visualized portions of the upper thorax are unremarkable.  IMPRESSION: 1. Right occipital scalp laceration/contusion with associated nondisplaced right occipital bone fracture, and contrecoup injury involving the left frontal lobe where there is a large intraparenchymal hemorrhagic contusion, as well as large adjacent left subdural hematoma and bilateral subarachnoid hemorrhage with small amount of intraventricular hemorrhage, and a large amount of left right midline shift with potential early changes of uncal herniation the left. Neurosurgical consultation is suggested if clinically appropriate. 2. No definite evidence of acute traumatic injury to the cervical spine. 3. There is what appears to be an old type 2 odontoid fracture, with a small amount of anterolisthesis of C1 relative to C2 along the fracture line, however, this is favored to be chronic. These results were discussed in person at the time of interpretation on 04/01/2014 at 9:48 PM to Dr. Barry Dienes, who verbally acknowledged these results.   Electronically Signed   By: Vinnie Langton M.D.   On: 04/05/2014 21:58   Ct Chest W Contrast  03/18/2014   CLINICAL DATA:  History of trauma from a fall.  Head injury.  EXAM: CT  CHEST, ABDOMEN, AND PELVIS WITH CONTRAST  TECHNIQUE: Multidetector CT imaging of the chest, abdomen and pelvis was performed following the standard protocol during bolus administration of intravenous contrast.  CONTRAST:  159m OMNIPAQUE IOHEXOL 300 MG/ML  SOLN  COMPARISON:  No priors.  FINDINGS: CT CHEST FINDINGS  Mediastinum: Patient is intubated with the tip of the endotracheal to approximately 2 cm above the carina. Nasogastric tube extends into the stomach. Heart size is normal. No abnormal high attenuation fluid within the mediastinum to suggest posttraumatic mediastinal hematoma. No evidence of posttraumatic aortic dissection/transection. Trace amount of pericardial fluid and/or thickening, unlikely to be of hemodynamic significance at this time. No pathologically enlarged mediastinal or hilar lymph nodes. Esophagus is unremarkable in appearance.  Lungs/Pleura: Small amount of ground-glass attenuation in the posterior aspect of the left upper lobe, favored to reflect sequela of mild aspiration, as there is some fluid and the distal trachea extending into the left mainstem bronchus and left upper lobe bronchi. No other consolidative airspace disease to suggest sequela of pulmonary contusion. No pneumothorax. No suspicious appearing pulmonary nodules or masses.  Musculoskeletal: No acute displaced fractures or aggressive appearing lytic or blastic lesions are noted in the visualized portions of the skeleton.  CT ABDOMEN AND PELVIS FINDINGS  Abdomen/Pelvis: Mild intrahepatic biliary ductal dilatation. No focal hepatic lesions are noted. Status post cholecystectomy. Common bile duct is also dilated measuring approximately 18 mm in the porta hepatis. This ductal dilatation is favored to be functional this post cholecystectomy patient. The appearance of the pancreas, spleen and bilateral adrenal glands is unremarkable. Mild multifocal cortical thinning in the kidneys bilaterally, compatible with mild scarring.  Multiple subcentimeter low attenuation lesions in the kidneys bilaterally are too small to definitively characterize, but are statistically likely to represent small cysts. No abnormal high attenuation fluid collection within the  peritoneal cavity or retroperitoneum to suggest posttraumatic hemorrhage. No evidence of acute posttraumatic dissection/transsection of the abdominal aorta or major abdominal and pelvic arterial branches.  Status post hysterectomy. Ovaries are not confidently identified may be surgically absent or atrophic. Urinary bladder is unremarkable in appearance. No significant volume of ascites. No pneumoperitoneum. No pathologic distention of small bowel.  Musculoskeletal: Chronic appearing compression fractures at L1 and L2, with approximately 50% loss of anterior vertebral body height at L1 and approximately 20% loss of anterior vertebral body height at L2. No acute displaced fractures or aggressive appearing lytic or blastic lesions are noted in the visualized portions of the skeleton.  IMPRESSION: 1. No evidence of significant acute traumatic injury to the chest, abdomen or pelvis. 2. Fluid in the trachea and left mainstem bronchus extending into the left upper lobe bronchi with some dependent ground-glass attenuation in the posterior aspect of the left upper lobe, likely to represent developing aspiration pneumonia/pneumonitis. 3. Additional incidental findings, as above.   Electronically Signed   By: Vinnie Langton M.D.   On: 03/21/2014 22:07   Ct Cervical Spine Wo Contrast  03/15/2014   CLINICAL DATA:  History of trauma from a fall. Patient is currently on blood thinners.  EXAM: CT HEAD WITHOUT CONTRAST  CT CERVICAL SPINE WITHOUT CONTRAST  TECHNIQUE: Multidetector CT imaging of the head and cervical spine was performed following the standard protocol without intravenous contrast. Multiplanar CT image reconstructions of the cervical spine were also generated.  COMPARISON:  No priors.   FINDINGS: CT HEAD FINDINGS  There is gas and high attenuation fluid in the right occipital scalp, compatible with a scalp laceration/ hematoma. In this area there is a very subtle nondisplaced fracture of the right occipital bone extending toward the foramen magnum. No other acute displaced fracture of the skull is noted at this time. There is a large amount of extra-axial high attenuation fluid compatible with probable subdural hemorrhage overlying the left cerebral convexity, measuring up to 2.1 cm in thickness. In addition, there is high attenuation fluid within the left frontal lobe, compatible with acute intraparenchymal hemorrhage. There is a small amount of subarachnoid hemorrhage in the left temporal region and right frontal region overlying the right frontal convexity. In addition, there is some intraventricular hemorrhage a lying dependently in the atrium of the right lateral ventricle. Severe left to right midline shift (1.5 cm). Some effacement of the basal cisterns is also noted, particularly on the left, which could be indicative of early uncal herniation. There is also some prominence of the temporal horn of the right lateral ventricle, which could suggest some early changes of hydrocephalus related to entrapment. Mastoids are well pneumatized bilaterally. Visualized paranasal sinuses are well pneumatized.  CT CERVICAL SPINE FINDINGS  There is what appears to be an old obliquely oriented type 2 odontoid fracture which has sclerotic margins. There is a small amount of anterolisthesis of C1 relative to C2 along the odontoid fracture line (3 mm), which may be chronic. Alignment otherwise appears anatomic. No other acute displaced cervical spine fractures are noted. Multilevel degenerative disc disease, most pronounced at C3-C4, C4-C5 and C5-C6. Mild multilevel facet arthropathy. Visualized portions of the upper thorax are unremarkable.  IMPRESSION: 1. Right occipital scalp laceration/contusion with  associated nondisplaced right occipital bone fracture, and contrecoup injury involving the left frontal lobe where there is a large intraparenchymal hemorrhagic contusion, as well as large adjacent left subdural hematoma and bilateral subarachnoid hemorrhage with small amount of intraventricular hemorrhage, and a  large amount of left right midline shift with potential early changes of uncal herniation the left. Neurosurgical consultation is suggested if clinically appropriate. 2. No definite evidence of acute traumatic injury to the cervical spine. 3. There is what appears to be an old type 2 odontoid fracture, with a small amount of anterolisthesis of C1 relative to C2 along the fracture line, however, this is favored to be chronic. These results were discussed in person at the time of interpretation on 03/31/2014 at 9:48 PM to Dr. Barry Dienes, who verbally acknowledged these results.   Electronically Signed   By: Vinnie Langton M.D.   On: 04/01/2014 21:58   Ct Abdomen Pelvis W Contrast  03/10/2014   CLINICAL DATA:  History of trauma from a fall.  Head injury.  EXAM: CT CHEST, ABDOMEN, AND PELVIS WITH CONTRAST  TECHNIQUE: Multidetector CT imaging of the chest, abdomen and pelvis was performed following the standard protocol during bolus administration of intravenous contrast.  CONTRAST:  118m OMNIPAQUE IOHEXOL 300 MG/ML  SOLN  COMPARISON:  No priors.  FINDINGS: CT CHEST FINDINGS  Mediastinum: Patient is intubated with the tip of the endotracheal to approximately 2 cm above the carina. Nasogastric tube extends into the stomach. Heart size is normal. No abnormal high attenuation fluid within the mediastinum to suggest posttraumatic mediastinal hematoma. No evidence of posttraumatic aortic dissection/transection. Trace amount of pericardial fluid and/or thickening, unlikely to be of hemodynamic significance at this time. No pathologically enlarged mediastinal or hilar lymph nodes. Esophagus is unremarkable in  appearance.  Lungs/Pleura: Small amount of ground-glass attenuation in the posterior aspect of the left upper lobe, favored to reflect sequela of mild aspiration, as there is some fluid and the distal trachea extending into the left mainstem bronchus and left upper lobe bronchi. No other consolidative airspace disease to suggest sequela of pulmonary contusion. No pneumothorax. No suspicious appearing pulmonary nodules or masses.  Musculoskeletal: No acute displaced fractures or aggressive appearing lytic or blastic lesions are noted in the visualized portions of the skeleton.  CT ABDOMEN AND PELVIS FINDINGS  Abdomen/Pelvis: Mild intrahepatic biliary ductal dilatation. No focal hepatic lesions are noted. Status post cholecystectomy. Common bile duct is also dilated measuring approximately 18 mm in the porta hepatis. This ductal dilatation is favored to be functional this post cholecystectomy patient. The appearance of the pancreas, spleen and bilateral adrenal glands is unremarkable. Mild multifocal cortical thinning in the kidneys bilaterally, compatible with mild scarring. Multiple subcentimeter low attenuation lesions in the kidneys bilaterally are too small to definitively characterize, but are statistically likely to represent small cysts. No abnormal high attenuation fluid collection within the peritoneal cavity or retroperitoneum to suggest posttraumatic hemorrhage. No evidence of acute posttraumatic dissection/transsection of the abdominal aorta or major abdominal and pelvic arterial branches.  Status post hysterectomy. Ovaries are not confidently identified may be surgically absent or atrophic. Urinary bladder is unremarkable in appearance. No significant volume of ascites. No pneumoperitoneum. No pathologic distention of small bowel.  Musculoskeletal: Chronic appearing compression fractures at L1 and L2, with approximately 50% loss of anterior vertebral body height at L1 and approximately 20% loss of  anterior vertebral body height at L2. No acute displaced fractures or aggressive appearing lytic or blastic lesions are noted in the visualized portions of the skeleton.  IMPRESSION: 1. No evidence of significant acute traumatic injury to the chest, abdomen or pelvis. 2. Fluid in the trachea and left mainstem bronchus extending into the left upper lobe bronchi with some dependent ground-glass attenuation in  the posterior aspect of the left upper lobe, likely to represent developing aspiration pneumonia/pneumonitis. 3. Additional incidental findings, as above.   Electronically Signed   By: Vinnie Langton M.D.   On: 03/08/2014 22:07   Dg Pelvis Portable  03/07/2014   CLINICAL DATA:  Status post fall.  Unresponsive.  EXAM: PORTABLE PELVIS 1-2 VIEWS  COMPARISON:  None.  FINDINGS: There is no evidence of pelvic fracture or diastasis. Soft tissues are normal.  IMPRESSION: No acute fracture or dislocation is identified.   Electronically Signed   By: Abelardo Diesel M.D.   On: 03/21/2014 21:48   Dg Chest Portable 1 View  03/09/2014   CLINICAL DATA:  Status post fall, unresponsive  EXAM: PORTABLE CHEST - 1 VIEW  COMPARISON:  None.  FINDINGS: The heart size and mediastinal contours are within normal limits. There is no focal infiltrate, pulmonary edema, or pleural effusion. Endotracheal tube is identified distal tip 5.7 cm from carina. There is no pneumothorax identified in visualized portions of the lungs but the right apex is not completely included. The visualized skeletal structures are unremarkable.  IMPRESSION: No active cardiopulmonary disease. Endotracheal tube identified distal tip 5.7 cm from carina.   Electronically Signed   By: Abelardo Diesel M.D.   On: 03/11/2014 21:50    Review of Systems  Unable to perform ROS: patient nonverbal   Blood pressure 70/35, pulse 57, temperature 94.8 F (34.9 C), resp. rate 15, height 5' 2"  (1.575 m), weight 63.504 kg (140 lb), SpO2 100.00%. Physical Exam   Neurological:  Unconscious, no response to noxious stim.  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  Complicated by Pradaxa.  No evidence of brain or brainstem fxn.  No neurosurgical interventions possible.  Rec terminal care.  Mallie Mussel A Melora Menon 03/20/2014, 12:39 AM

## 2014-04-03 NOTE — Discharge Summary (Signed)
  Physician Discharge Summary  Patient ID: Maria [name] MRN: [id number] DOB/AGE: 11/24/1924 80 y.o.  Admit date: 03-31-14 Discharge date: 04/03/2014  Discharge Diagnoses Patient Active Problem List   Diagnosis Date Noted  . TBI (traumatic brain injury) 03/31/14    Consultants Dr. Jordan Likes (Neurosurgery)  Procedures None   Hospital Course:  Of note  I had no contact or involvement with the patient or her care during her hospital stay.  The entire hospital course was obtained straight from the medical record.    80 yo female chronically coagulated with Pradaxa who presented as a level 1 trauma. She was found unresponsive in her yard by her neighbors who saw blood on a rock and called 911. She had GCS 3 throughout EMS and hospital evaluation. She was incontinent of stool which was noted when she arrived. She did not feel cold. She appeared to have vomitus in mouth upon EMS evaluation. There was suspicion of brain matter on skull on the EMS callout, but it appeared to be congealed blood.  Her family noted that she had been having recent "spells" of passing out and falling in the last few weeks.   Workup showed TBI with B/L SAH, Left ICH, and Left SDH, skull fracture, hypothermia, hypokalemia.  Neurosurgery (Dr. Jordan Likes) was consulted who agreed that this injury is not likely survivable.  He recommended terminal care.  The family was contacted and they decided on withdrawal of care in the ICU.  She died at 6.    Signed: Rueben Bash. Dort, [facility]  Trauma Service [phone]  04/03/2014, 3:51 PM

## 2014-04-05 NOTE — Discharge Summary (Signed)
Agree with above 

## 2014-04-06 DEATH — deceased

## 2015-04-11 IMAGING — CT CT HEAD W/O CM
4 of 9 series · 12 of 47 positions shown, 13 images · non-contrast
Comparison: No priors.

CLINICAL DATA: History of trauma from a fall. Patient is currently
on blood thinners.

EXAM:
CT HEAD WITHOUT CONTRAST
CT CERVICAL SPINE WITHOUT CONTRAST
TECHNIQUE: Multidetector CT imaging of the head and cervical spine was
performed following the standard protocol without intravenous
contrast. Multiplanar CT image reconstructions of the cervical spine
were also generated.

[Series 3: head 5.0 h30s · axial · 0.46mm/px · z∈[+492,+567]mm · 3 of 31 slices shown, 4 images]
[im 8/31  brain]
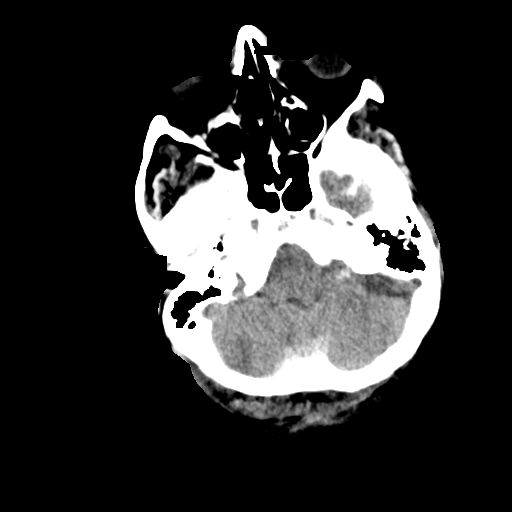
[im 8/31  bone]
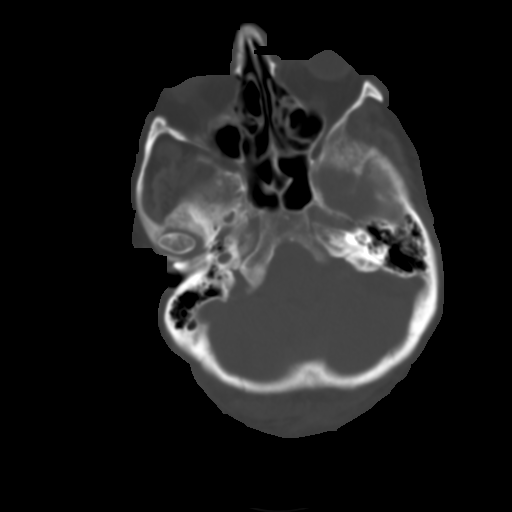
[im 16/31  brain]
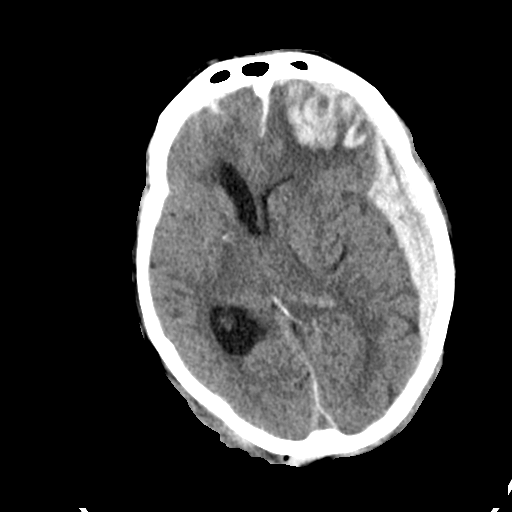
[im 23/31  brain]
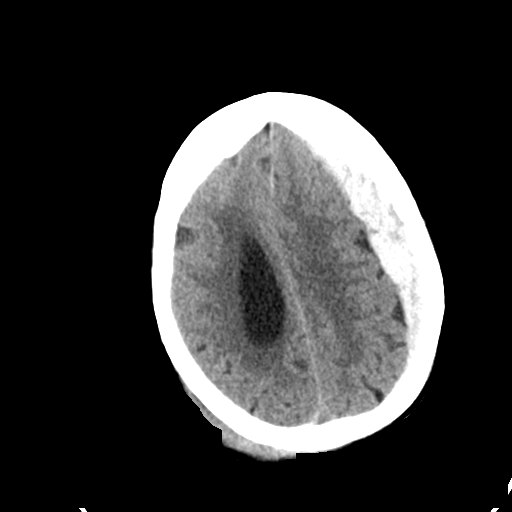

[Series 11: lower coronals · coronal · 0.20mm/px · 3 of 48 slices shown]
[im 4/48  brain]
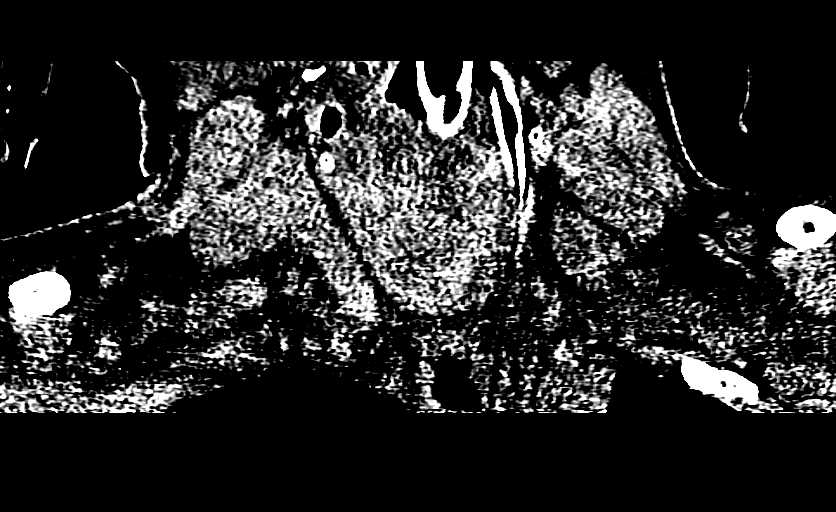
[im 15/48  brain]
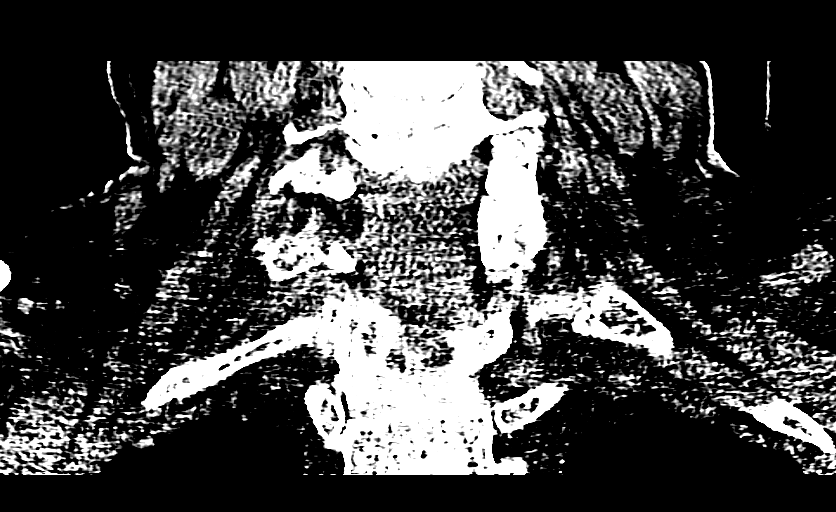
[im 26/48  brain]
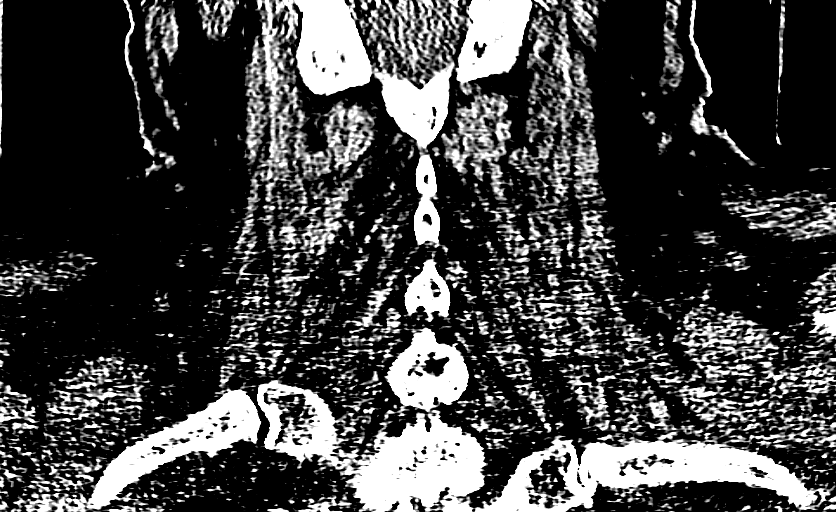

[Series 13: upper orthogonals · axial · 0.20mm/px · z∈[+369,+423]mm · 4 of 55 slices shown]
[im 10/55  brain]
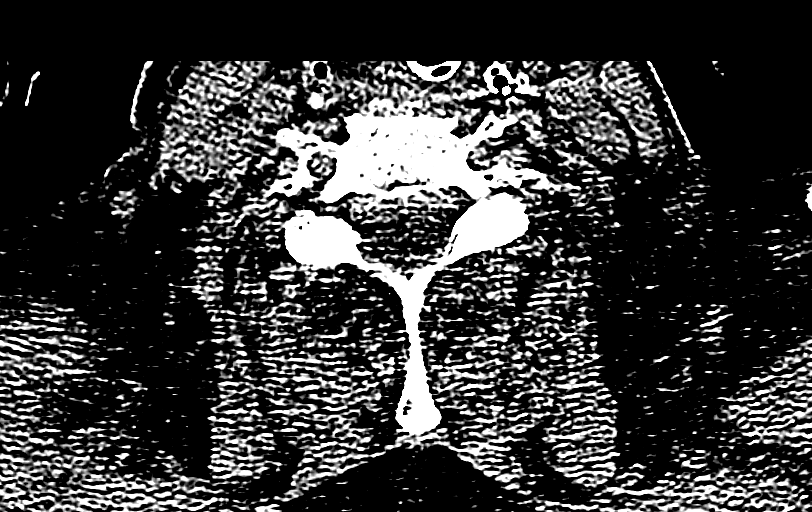
[im 19/55  brain]
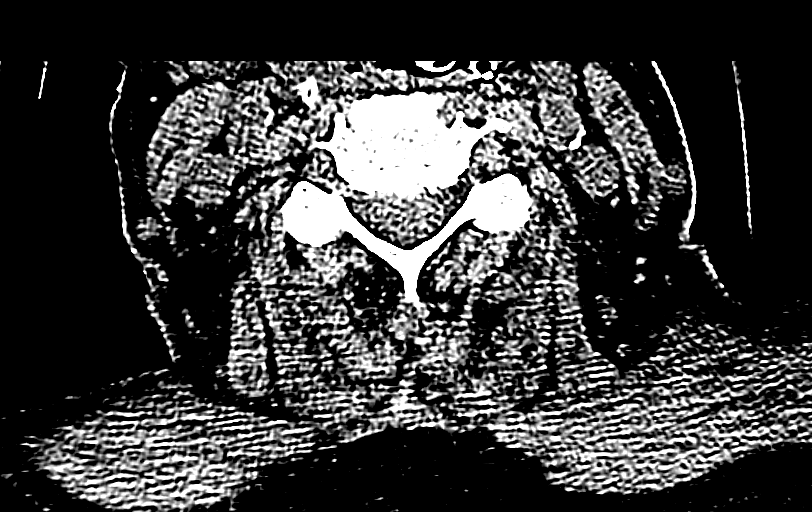
[im 28/55  brain]
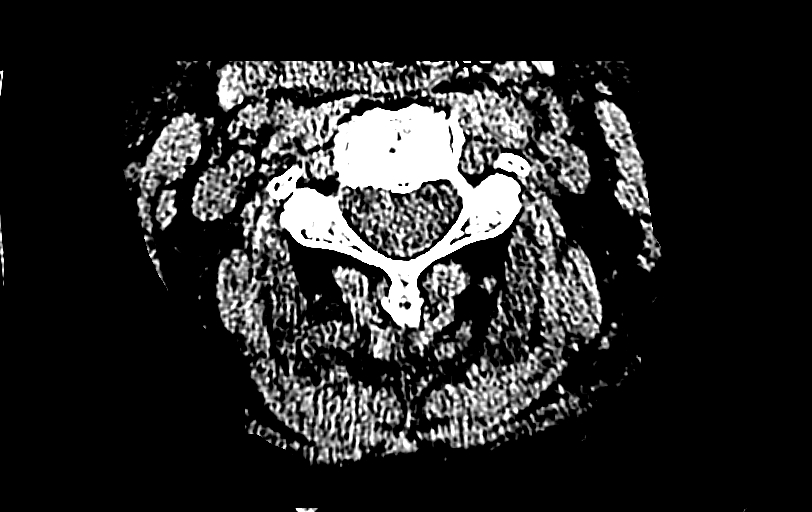
[im 37/55  brain]
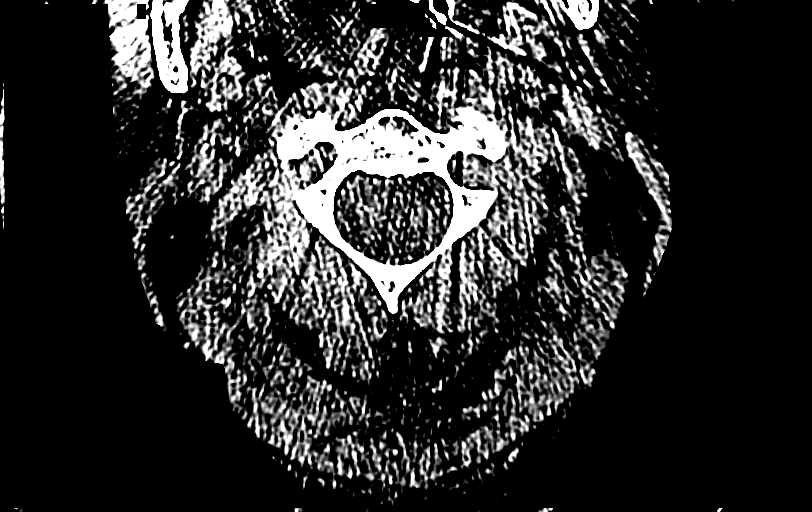

[Series 17: sagittal · sagittal · 1.16mm/px · 2 of 63 slices shown]
[im 21/63  brain]
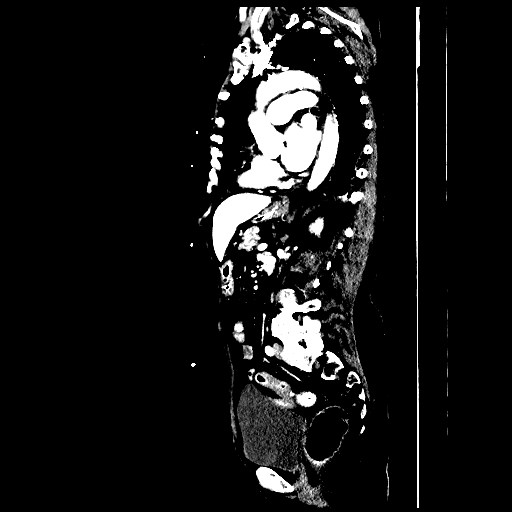
[im 42/63  brain]
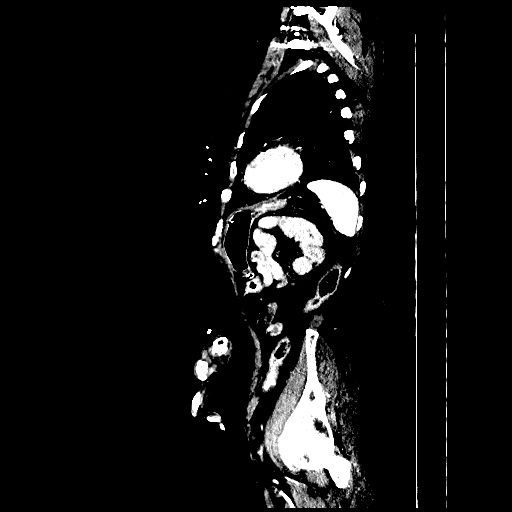

[12 of 47 positions shown; findings below may reference images not displayed]

FINDINGS: CT HEAD FINDINGS

There is gas and high attenuation fluid in the right occipital
scalp, compatible with a scalp laceration/ hematoma. In this area
there is a very subtle nondisplaced fracture of the right occipital
bone extending toward the foramen magnum. No other acute displaced
fracture of the skull is noted at this time. There is a large amount
of extra-axial high attenuation fluid compatible with probable
subdural hemorrhage overlying the left cerebral convexity, measuring
up to 2.1 cm in thickness. In addition, there is high attenuation
fluid within the left frontal lobe, compatible with acute
intraparenchymal hemorrhage. There is a small amount of subarachnoid
hemorrhage in the left temporal region and right frontal region
overlying the right frontal convexity. In addition, there is some
intraventricular hemorrhage a lying dependently in the atrium of the
right lateral ventricle. Severe left to right midline shift (1.5
cm). Some effacement of the basal cisterns is also noted,
particularly on the left, which could be indicative of early uncal
herniation. There is also some prominence of the temporal horn of
the right lateral ventricle, which could suggest some early changes
of hydrocephalus related to entrapment. Mastoids are well
pneumatized bilaterally. Visualized paranasal sinuses are well
pneumatized.

CT CERVICAL SPINE FINDINGS

There is what appears to be an old obliquely oriented type 2
odontoid fracture which has sclerotic margins. There is a small
amount of anterolisthesis of C1 relative to C2 along the odontoid
fracture line (3 mm), which may be chronic. Alignment otherwise
appears anatomic. No other acute displaced cervical spine fractures
are noted. Multilevel degenerative disc disease, most pronounced at
C3-C4, C4-C5 and C5-C6. Mild multilevel facet arthropathy.
Visualized portions of the upper thorax are unremarkable.
IMPRESSION: 1. Right occipital scalp laceration/contusion with associated
nondisplaced right occipital bone fracture, and contrecoup injury
involving the left frontal lobe where there is a large
intraparenchymal hemorrhagic contusion, as well as large adjacent
left subdural hematoma and bilateral subarachnoid hemorrhage with
small amount of intraventricular hemorrhage, and a large amount of
left right midline shift with potential early changes of uncal
herniation the left. Neurosurgical consultation is suggested if
clinically appropriate.
2. No definite evidence of acute traumatic injury to the cervical
spine.
3. There is what appears to be an old type 2 odontoid fracture, with
a small amount of anterolisthesis of C1 relative to C2 along the
fracture line, however, this is favored to be chronic.
These results were discussed in person at the time of interpretation
on 03/22/2014 at [DATE] to Dr. Laquan, who verbally acknowledged
these results.
